# Patient Record
Sex: Male | Born: 1965 | Race: Black or African American | Hispanic: No | Smoking: Former smoker
Health system: Southern US, Community
[De-identification: ages and names within clinical notes are randomized; demographics above are authoritative.]

## PROBLEM LIST (undated history)

## (undated) DIAGNOSIS — N189 Chronic kidney disease, unspecified: Secondary | ICD-10-CM

## (undated) DIAGNOSIS — Z940 Kidney transplant status: Secondary | ICD-10-CM

## (undated) DIAGNOSIS — I471 Supraventricular tachycardia, unspecified: Secondary | ICD-10-CM

## (undated) DIAGNOSIS — IMO0001 Reserved for inherently not codable concepts without codable children: Secondary | ICD-10-CM

## (undated) DIAGNOSIS — I10 Essential (primary) hypertension: Secondary | ICD-10-CM

## (undated) DIAGNOSIS — Z992 Dependence on renal dialysis: Secondary | ICD-10-CM

## (undated) DIAGNOSIS — K219 Gastro-esophageal reflux disease without esophagitis: Secondary | ICD-10-CM

## (undated) DIAGNOSIS — A630 Anogenital (venereal) warts: Secondary | ICD-10-CM

## (undated) DIAGNOSIS — I639 Cerebral infarction, unspecified: Secondary | ICD-10-CM

## (undated) HISTORY — PX: NASAL FRACTURE SURGERY: SHX718

## (undated) HISTORY — PX: HERNIA REPAIR: SHX51

## (undated) HISTORY — PX: AV FISTULA PLACEMENT: SHX1204

---

## 1997-11-23 ENCOUNTER — Ambulatory Visit (HOSPITAL_COMMUNITY): Admission: RE | Admit: 1997-11-23 | Discharge: 1997-11-23 | Payer: Self-pay | Admitting: Nephrology

## 2006-06-19 HISTORY — PX: KIDNEY TRANSPLANT: SHX239

## 2009-12-15 ENCOUNTER — Emergency Department (HOSPITAL_BASED_OUTPATIENT_CLINIC_OR_DEPARTMENT_OTHER): Admission: EM | Admit: 2009-12-15 | Discharge: 2009-12-15 | Payer: Self-pay | Admitting: Emergency Medicine

## 2010-02-02 ENCOUNTER — Emergency Department (HOSPITAL_BASED_OUTPATIENT_CLINIC_OR_DEPARTMENT_OTHER): Admission: EM | Admit: 2010-02-02 | Discharge: 2010-02-03 | Payer: Self-pay | Admitting: Emergency Medicine

## 2010-02-03 ENCOUNTER — Ambulatory Visit: Payer: Self-pay | Admitting: Diagnostic Radiology

## 2010-05-06 ENCOUNTER — Encounter
Admission: RE | Admit: 2010-05-06 | Discharge: 2010-06-14 | Payer: Self-pay | Source: Home / Self Care | Attending: Family Medicine | Admitting: Family Medicine

## 2010-06-01 ENCOUNTER — Emergency Department (HOSPITAL_BASED_OUTPATIENT_CLINIC_OR_DEPARTMENT_OTHER)
Admission: EM | Admit: 2010-06-01 | Discharge: 2010-06-01 | Payer: Self-pay | Source: Home / Self Care | Admitting: Emergency Medicine

## 2010-06-20 ENCOUNTER — Encounter: Admit: 2010-06-20 | Payer: Self-pay | Admitting: Family Medicine

## 2010-06-22 ENCOUNTER — Emergency Department (HOSPITAL_BASED_OUTPATIENT_CLINIC_OR_DEPARTMENT_OTHER)
Admission: EM | Admit: 2010-06-22 | Discharge: 2010-06-22 | Payer: Self-pay | Source: Home / Self Care | Admitting: Emergency Medicine

## 2010-06-29 ENCOUNTER — Encounter
Admission: RE | Admit: 2010-06-29 | Discharge: 2010-07-18 | Payer: Self-pay | Source: Home / Self Care | Attending: Family Medicine | Admitting: Family Medicine

## 2010-07-20 ENCOUNTER — Ambulatory Visit: Payer: Medicare Other | Attending: Family Medicine | Admitting: Rehabilitation

## 2010-07-20 DIAGNOSIS — M545 Low back pain, unspecified: Secondary | ICD-10-CM | POA: Insufficient documentation

## 2010-07-20 DIAGNOSIS — M2569 Stiffness of other specified joint, not elsewhere classified: Secondary | ICD-10-CM | POA: Insufficient documentation

## 2010-07-20 DIAGNOSIS — IMO0001 Reserved for inherently not codable concepts without codable children: Secondary | ICD-10-CM | POA: Insufficient documentation

## 2010-07-21 ENCOUNTER — Encounter: Payer: Medicare Other | Admitting: Physical Therapy

## 2010-08-30 LAB — COMPREHENSIVE METABOLIC PANEL
ALT: 31 U/L (ref 0–53)
AST: 33 U/L (ref 0–37)
Albumin: 4.4 g/dL (ref 3.5–5.2)
Alkaline Phosphatase: 124 U/L — ABNORMAL HIGH (ref 39–117)
CO2: 18 mEq/L — ABNORMAL LOW (ref 19–32)
Calcium: 9.3 mg/dL (ref 8.4–10.5)
Creatinine, Ser: 1.8 mg/dL — ABNORMAL HIGH (ref 0.4–1.5)
Glucose, Bld: 95 mg/dL (ref 70–99)
Potassium: 5.8 mEq/L — ABNORMAL HIGH (ref 3.5–5.1)

## 2010-08-30 LAB — URINALYSIS, ROUTINE W REFLEX MICROSCOPIC
Bilirubin Urine: NEGATIVE
Specific Gravity, Urine: 1.018 (ref 1.005–1.030)
pH: 5.5 (ref 5.0–8.0)

## 2010-08-30 LAB — BASIC METABOLIC PANEL
BUN: 24 mg/dL — ABNORMAL HIGH (ref 6–23)
Calcium: 8.8 mg/dL (ref 8.4–10.5)
Calcium: 9.1 mg/dL (ref 8.4–10.5)
Chloride: 111 mEq/L (ref 96–112)
Creatinine, Ser: 1.7 mg/dL — ABNORMAL HIGH (ref 0.4–1.5)
GFR calc Af Amer: 53 mL/min — ABNORMAL LOW (ref >60)
GFR calc non Af Amer: 41 mL/min — ABNORMAL LOW (ref >60)
Glucose, Bld: 120 mg/dL — ABNORMAL HIGH (ref 70–99)
Potassium: 4.6 mEq/L (ref 3.5–5.1)

## 2010-08-30 LAB — LIPASE, BLOOD: Lipase: 462 U/L — ABNORMAL HIGH (ref 23–300)

## 2011-03-31 ENCOUNTER — Encounter: Payer: Self-pay | Admitting: *Deleted

## 2011-03-31 ENCOUNTER — Emergency Department (HOSPITAL_BASED_OUTPATIENT_CLINIC_OR_DEPARTMENT_OTHER)
Admission: EM | Admit: 2011-03-31 | Discharge: 2011-03-31 | Disposition: A | Discharge status: Home / Self Care | Payer: Medicare Other | Attending: Emergency Medicine | Admitting: Emergency Medicine

## 2011-03-31 DIAGNOSIS — M25579 Pain in unspecified ankle and joints of unspecified foot: Secondary | ICD-10-CM | POA: Insufficient documentation

## 2011-03-31 DIAGNOSIS — M25572 Pain in left ankle and joints of left foot: Secondary | ICD-10-CM

## 2011-03-31 DIAGNOSIS — M109 Gout, unspecified: Secondary | ICD-10-CM | POA: Insufficient documentation

## 2011-03-31 DIAGNOSIS — N189 Chronic kidney disease, unspecified: Secondary | ICD-10-CM | POA: Insufficient documentation

## 2011-03-31 DIAGNOSIS — I129 Hypertensive chronic kidney disease with stage 1 through stage 4 chronic kidney disease, or unspecified chronic kidney disease: Secondary | ICD-10-CM | POA: Insufficient documentation

## 2011-03-31 HISTORY — DX: Chronic kidney disease, unspecified: N18.9

## 2011-03-31 HISTORY — DX: Essential (primary) hypertension: I10

## 2011-03-31 HISTORY — DX: Reserved for inherently not codable concepts without codable children: IMO0001

## 2011-03-31 HISTORY — DX: Gastro-esophageal reflux disease without esophagitis: K21.9

## 2011-03-31 MED ORDER — OXYCODONE-ACETAMINOPHEN 5-325 MG PO TABS
1.0000 | ORAL_TABLET | ORAL | Status: DC | PRN
  Administered 2011-03-31: 1 via ORAL
  Filled 2011-03-31: qty 1

## 2011-03-31 MED ORDER — PREDNISONE 10 MG PO TABS: 40.0000 mg | ORAL_TABLET | Freq: Every day | ORAL | Status: AC

## 2011-03-31 MED ORDER — KETOROLAC TROMETHAMINE 15 MG/ML IJ SOLN
15.0000 mg | Freq: Once | INTRAMUSCULAR | Status: AC
  Administered 2011-03-31: 15 mg via INTRAVENOUS
  Filled 2011-03-31: qty 1

## 2011-03-31 MED ORDER — OXYCODONE-ACETAMINOPHEN 5-325 MG PO TABS: 2.0000 | ORAL_TABLET | ORAL | Status: AC | PRN

## 2011-03-31 MED ORDER — PREDNISONE 50 MG PO TABS
60.0000 mg | ORAL_TABLET | Freq: Once | ORAL | Status: AC
  Administered 2011-03-31: 60 mg via ORAL
  Filled 2011-03-31: qty 1

## 2011-03-31 NOTE — ED Notes (Signed)
Patient states he developed sudden onset of left foot pain.  Pain gradually increased all day.  Denies injury.  Past hx of gout.

## 2011-03-31 NOTE — ED Provider Notes (Signed)
History   45yM with L foot pain. Do not agree with triage note. Pt states gradual onset pain. Denies trauma. Increasing. No fever or chills. No n/v. No numbness or tingling. Ambulating. No rash. Denies signficant acute pain anywehre else.  CSN: [id number] Arrival date & time: 03/31/2011 11:23 AM  Chief Complaint  Patient presents with  . Foot Pain    Left foot pain    (Consider location/radiation/quality/duration/timing/severity/associated sxs/prior treatment) HPI  Past Medical History  Diagnosis Date  . Chronic renal failure     S/P renal transplant  . Hypertension   . Reflux     Past Surgical History  Procedure Date  . Kidney transplant   . Av fistula placement     left arm  . Hernia repair   . Nasal fracture surgery     No family history on file.  History  Substance Use Topics  . Smoking status: Former Games developer  . Smokeless tobacco: Not on file  . Alcohol Use: No      Review of Systems   Review of symptoms negative unless otherwise noted in HPI.   Allergies  Ivp dye  Home Medications   Current Outpatient Rx  Name Route Sig Dispense Refill  . AMLODIPINE BESYLATE 10 MG PO TABS Oral Take 10 mg by mouth daily.      Marland Kitchen CLONIDINE HCL 0.3 MG PO TABS Oral Take 0.3 mg by mouth 2 (two) times daily.      Marland Kitchen ESOMEPRAZOLE MAGNESIUM 20 MG PO CPDR Oral Take 10 mg by mouth daily before breakfast.      . IBUPROFEN 200 MG PO TABS Oral Take 200 mg by mouth every 6 (six) hours as needed.      Marland Kitchen METOPROLOL SUCCINATE 200 MG PO TB24 Oral Take 200 mg by mouth 2 (two) times daily.      Marland Kitchen SPIRONOLACTONE 25 MG PO TABS Oral Take 25 mg by mouth daily.      Marland Kitchen TACROLIMUS 1 MG PO CAPS Oral Take 6 mg by mouth 2 (two) times daily.      Marland Kitchen PREDNISONE 10 MG PO TABS Oral Take 4 tablets (40 mg total) by mouth daily. 10 tablet 0    BP 153/85  Pulse 74  Temp(Src) 97.7 F (36.5 C) (Oral)  Resp 20  Ht 6\' 1"  (1.854 m)  Wt 241 lb (109.317 kg)  BMI 31.80 kg/m2  SpO2 100%  Physical Exam    Nursing note and vitals reviewed. Constitutional: He appears well-developed and well-nourished. No distress.  HENT:  Head: Normocephalic and atraumatic.  Eyes: Conjunctivae are normal. Right eye exhibits no discharge. Left eye exhibits no discharge.  Neck: Neck supple.  Cardiovascular: Normal rate, regular rhythm and normal heart sounds.  Exam reveals no gallop and no friction rub.   No murmur heard. Pulmonary/Chest: Effort normal and breath sounds normal. No respiratory distress.  Abdominal: Soft. He exhibits no distension. There is no tenderness.  Musculoskeletal:       Mild diffuse swelling L ankle. No signifcant skin changes otherwise. neurovascualarly intact. Increased pain with rom both passive and aactive.  Neurological: He is alert.  Skin: Skin is warm and dry.  Psychiatric: He has a normal mood and affect. His behavior is normal. Thought content normal.    ED Course  Procedures (including critical care time)  Labs Reviewed - No data to display No results found.   1. Ankle pain, left   2. Gout attack       MDM  45yM with L ankle pain. Atraumatic. Hx of gout and clinically suspect. Same. Also consider septic joint but clinically less likley. Plan symptomatic tx and pcp fu.        Raeford Razor, MD 04/04/11 [phone]

## 2011-04-21 ENCOUNTER — Encounter (HOSPITAL_BASED_OUTPATIENT_CLINIC_OR_DEPARTMENT_OTHER): Payer: Self-pay | Admitting: *Deleted

## 2011-04-21 ENCOUNTER — Emergency Department (INDEPENDENT_AMBULATORY_CARE_PROVIDER_SITE_OTHER): Payer: Medicare Other

## 2011-04-21 ENCOUNTER — Emergency Department (HOSPITAL_BASED_OUTPATIENT_CLINIC_OR_DEPARTMENT_OTHER)
Admission: EM | Admit: 2011-04-21 | Discharge: 2011-04-21 | Disposition: A | Discharge status: Home / Self Care | Payer: Medicare Other | Attending: Emergency Medicine | Admitting: Emergency Medicine

## 2011-04-21 DIAGNOSIS — Z940 Kidney transplant status: Secondary | ICD-10-CM | POA: Insufficient documentation

## 2011-04-21 DIAGNOSIS — M25471 Effusion, right ankle: Secondary | ICD-10-CM

## 2011-04-21 DIAGNOSIS — M25476 Effusion, unspecified foot: Secondary | ICD-10-CM | POA: Insufficient documentation

## 2011-04-21 DIAGNOSIS — I10 Essential (primary) hypertension: Secondary | ICD-10-CM | POA: Insufficient documentation

## 2011-04-21 DIAGNOSIS — M79609 Pain in unspecified limb: Secondary | ICD-10-CM | POA: Insufficient documentation

## 2011-04-21 DIAGNOSIS — M25579 Pain in unspecified ankle and joints of unspecified foot: Secondary | ICD-10-CM

## 2011-04-21 DIAGNOSIS — M25473 Effusion, unspecified ankle: Secondary | ICD-10-CM | POA: Insufficient documentation

## 2011-04-21 DIAGNOSIS — K219 Gastro-esophageal reflux disease without esophagitis: Secondary | ICD-10-CM | POA: Insufficient documentation

## 2011-04-21 LAB — GRAM STAIN: Special Requests: NORMAL

## 2011-04-21 LAB — SYNOVIAL CELL COUNT + DIFF, W/ CRYSTALS
Crystals, Fluid: NONE SEEN
Monocyte-Macrophage-Synovial Fluid: 18 % — ABNORMAL LOW (ref 50–90)
WBC, Synovial: 33 /mm3 (ref 0–200)

## 2011-04-21 MED ORDER — LIDOCAINE HCL (PF) 1 % IJ SOLN
INTRAMUSCULAR | Status: AC
  Administered 2011-04-21: 5 mL via INTRADERMAL
  Filled 2011-04-21: qty 5

## 2011-04-21 MED ORDER — LIDOCAINE HCL (PF) 1 % IJ SOLN
5.0000 mL | Freq: Once | INTRAMUSCULAR | Status: AC
  Administered 2011-04-21: 5 mL via INTRADERMAL

## 2011-04-21 MED ORDER — PREDNISONE 50 MG PO TABS: ORAL_TABLET | ORAL | Status: AC

## 2011-04-21 MED ORDER — OXYCODONE-ACETAMINOPHEN 5-325 MG PO TABS
1.0000 | ORAL_TABLET | Freq: Once | ORAL | Status: AC
  Administered 2011-04-21: 1 via ORAL
  Filled 2011-04-21: qty 1

## 2011-04-21 MED ORDER — HYDROMORPHONE HCL PF 2 MG/ML IJ SOLN
2.0000 mg | Freq: Once | INTRAMUSCULAR | Status: AC
  Administered 2011-04-21: 2 mg via INTRAMUSCULAR
  Filled 2011-04-21: qty 20

## 2011-04-21 MED ORDER — OXYCODONE-ACETAMINOPHEN 5-325 MG PO TABS: 1.0000 | ORAL_TABLET | ORAL | Status: AC | PRN

## 2011-04-21 NOTE — ED Provider Notes (Signed)
History     CSN: [id number] Arrival date & time: 04/21/2011  1:39 PM   First MD Initiated Contact with Patient 04/21/11 1350      Chief Complaint  Patient presents with  . Foot Pain    (Consider location/radiation/quality/duration/timing/severity/associated sxs/prior treatment) Patient is a 45 y.o. male presenting with lower extremity pain. The history is provided by the patient.  Foot Pain The current episode started more than 2 days ago. The problem occurs daily. The problem has been gradually worsening. Pertinent negatives include no chest pain and no shortness of breath. The symptoms are aggravated by standing. The symptoms are relieved by rest.  pt reports gradual onset of right foot/ankle pain over past week No trauma Reports it hurts to walk Reports h/o gout but denies any joint aspirations H/o renal failure, but he is s/p transplant 2008 no longer on Hemodialysis No cp/sob No fever Denies h/o DVT  Past Medical History  Diagnosis Date  . Chronic renal failure     S/P renal transplant  . Hypertension   . Reflux   . Gout     Past Surgical History  Procedure Date  . Kidney transplant   . Av fistula placement     left arm  . Hernia repair   . Nasal fracture surgery     History reviewed. No pertinent family history.  History  Substance Use Topics  . Smoking status: Former Games developer  . Smokeless tobacco: Not on file  . Alcohol Use: No      Review of Systems  Respiratory: Negative for shortness of breath.   Cardiovascular: Negative for chest pain.  All other systems reviewed and are negative.    Allergies  Ivp dye  Home Medications   Current Outpatient Rx  Name Route Sig Dispense Refill  . AMLODIPINE BESYLATE 10 MG PO TABS Oral Take 10 mg by mouth daily.      Marland Kitchen CLONIDINE HCL 0.3 MG PO TABS Oral Take 0.3 mg by mouth 2 (two) times daily.      Marland Kitchen ESOMEPRAZOLE MAGNESIUM 20 MG PO CPDR Oral Take 10 mg by mouth daily before breakfast.      . IBUPROFEN 200  MG PO TABS Oral Take 200 mg by mouth every 6 (six) hours as needed.      Marland Kitchen METOPROLOL SUCCINATE 200 MG PO TB24 Oral Take 200 mg by mouth 2 (two) times daily.      Marland Kitchen SPIRONOLACTONE 25 MG PO TABS Oral Take 25 mg by mouth daily.      Marland Kitchen TACROLIMUS 1 MG PO CAPS Oral Take 6 mg by mouth 2 (two) times daily.        BP 159/97  Pulse 73  Temp(Src) 98.4 F (36.9 C) (Oral)  Resp 16  Ht 6\' 1"  (1.854 m)  Wt 240 lb (108.863 kg)  BMI 31.66 kg/m2  SpO2 99%  Physical Exam CONSTITUTIONAL: Well developed/well nourished HEAD AND FACE: Normocephalic/atraumatic EYES: EOMI/PERRL ENMT: Mucous membranes moist NECK: supple no meningeal signs CV: S1/S2 noted, no murmurs/rubs/gallops noted LUNGS: Lungs are clear to auscultation bilaterally, no apparent distress ABDOMEN: soft, nontender, no rebound or guarding NEURO: Pt is awake/alert, moves all extremitiesx4 EXTREMITIES: pulses normal, full ROM Tender to right medial malleolus with localized swelling, minimal erythema.  No crepitance.  No streaking of erythema.  No calf tenderness.  Right achilles intact.  No distal right foot tenderness No pitting edema noted in either LE SKIN: warm, color normal PSYCH: no abnormalities of mood noted  ED  Course  Apiration of blood/fluid Date/Time: 04/21/2011 2:59 PM Performed by: Joya Gaskins Authorized by: Joya Gaskins Consent: Written consent obtained. Risks and benefits: risks, benefits and alternatives were discussed Consent given by: patient Patient understanding: patient states understanding of the procedure being performed Patient consent: the patient's understanding of the procedure matches consent given Procedure consent: procedure consent matches procedure scheduled Relevant documents: relevant documents present and verified Site marked: the operative site was marked Imaging studies: imaging studies available Patient identity confirmed: verbally with patient, arm band and provided demographic  data Time out: Immediately prior to procedure a "time out" was called to verify the correct patient, procedure, equipment, support staff and site/side marked as required. Preparation: Patient was prepped and draped in the usual sterile fashion. Local anesthesia used: yes Anesthesia: local infiltration Local anesthetic: lidocaine 1% without epinephrine Anesthetic total: 4 ml Patient sedated: no Patient tolerance: Patient tolerated the procedure well with no immediate complications. Comments: Right ankle arthrocentesis 2ml of synovial fluid extracted Pt had no immediate complications     MDM  Nursing notes reviewed and considered in documentation Previous records reviewed and considered xrays reviewed and considered  2:14 PM Pt reports he was told he has gout but no definitive diagnosis Reports similar history of this pain in left ankle  Pt with frequent ankle swelling, never been formally diagnosed with gout, he allowed me to perform arthrocentesis to r/o septic joint   3:18 PM PT HAS NO COMPLICATIONS FROM PROCEDURE ONLY SMALL AMT OF FLUID EXTRACTED, SO LIKELY WILL ONLY GET CULTURE SUSPICION FOR GOUT STILL HIGH, WILL GIVE RX FOR PREDNISONE BUT ASKED HIM TO HOLD UNTIL CULTURES ARE CONFIRMED NEGATIVE.  PT AGREEABLE.  TOLD TO USE CRUTCHES, F/U WITH ORTHO BP 152/98  Pulse 62  Temp(Src) 98.4 F (36.9 C) (Oral)  Resp 17  Ht 6\' 1"  (1.854 m)  Wt 240 lb (108.863 kg)  BMI 31.66 kg/m2  SpO2 100%   Joya Gaskins, MD 04/21/11 1526

## 2011-04-21 NOTE — ED Notes (Signed)
Pt c/o right foot pain x 1 week Hx gout

## 2011-04-22 ENCOUNTER — Encounter (HOSPITAL_BASED_OUTPATIENT_CLINIC_OR_DEPARTMENT_OTHER): Payer: Self-pay | Admitting: *Deleted

## 2011-04-22 ENCOUNTER — Emergency Department (HOSPITAL_BASED_OUTPATIENT_CLINIC_OR_DEPARTMENT_OTHER)
Admission: EM | Admit: 2011-04-22 | Discharge: 2011-04-22 | Disposition: A | Discharge status: Home / Self Care | Payer: Medicare Other | Attending: Emergency Medicine | Admitting: Emergency Medicine

## 2011-04-22 DIAGNOSIS — K219 Gastro-esophageal reflux disease without esophagitis: Secondary | ICD-10-CM | POA: Insufficient documentation

## 2011-04-22 DIAGNOSIS — M79673 Pain in unspecified foot: Secondary | ICD-10-CM

## 2011-04-22 DIAGNOSIS — M25579 Pain in unspecified ankle and joints of unspecified foot: Secondary | ICD-10-CM | POA: Insufficient documentation

## 2011-04-22 DIAGNOSIS — Z79899 Other long term (current) drug therapy: Secondary | ICD-10-CM | POA: Insufficient documentation

## 2011-04-22 DIAGNOSIS — I10 Essential (primary) hypertension: Secondary | ICD-10-CM | POA: Insufficient documentation

## 2011-04-22 HISTORY — DX: Kidney transplant status: Z94.0

## 2011-04-22 LAB — BASIC METABOLIC PANEL
BUN: 29 mg/dL — ABNORMAL HIGH (ref 6–23)
CO2: 24 mEq/L (ref 19–32)
Chloride: 100 mEq/L (ref 96–112)
GFR calc non Af Amer: 38 mL/min — ABNORMAL LOW (ref >90)
Glucose, Bld: 138 mg/dL — ABNORMAL HIGH (ref 70–99)
Potassium: 3.8 mEq/L (ref 3.5–5.1)

## 2011-04-22 MED ORDER — HYDROMORPHONE HCL PF 2 MG/ML IJ SOLN
2.0000 mg | Freq: Once | INTRAMUSCULAR | Status: AC
Start: 2011-04-22 — End: 2011-04-22
  Administered 2011-04-22: 2 mg via INTRAMUSCULAR
  Filled 2011-04-22: qty 1

## 2011-04-22 MED ORDER — HYDROMORPHONE HCL PF 2 MG/ML IJ SOLN
2.0000 mg | Freq: Once | INTRAMUSCULAR | Status: AC
  Administered 2011-04-22: 2 mg via INTRAMUSCULAR
  Filled 2011-04-22: qty 1
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
## 2011-04-22 NOTE — ED Provider Notes (Signed)
Scribed for Glynn Octave, MD, the patient was seen in room MH03/MH03. This chart was scribed by [employer]. The patient's care started at 18:33  CSN: [id number] Arrival date & time: 04/22/2011  6:17 PM   First MD Initiated Contact with Patient 04/22/11 1833      Chief Complaint  Patient presents with  . Foot Pain   HPI Nicholas Green is a 45 y.o. male who presents to the Emergency Department complaining of Right foot pain. Patient states that he was seen here yesterday and was diagnosed with gout. States that he was prescribed Percocet and Steroids that have not alleviated his pain. He denies any fevers, recent trauma to the foot or any other joint pain. States that pain is aggravated by bearing weight on foot. Patient appears distressed secondary to foot pain.  Past Medical History  Diagnosis Date  . Hypertension   . Reflux   . Gout   . Chronic renal failure     S/P renal transplant  . S/P kidney transplant     Past Surgical History  Procedure Date  . Kidney transplant   . Av fistula placement     left arm  . Hernia repair   . Nasal fracture surgery     No family history on file.  History  Substance Use Topics  . Smoking status: Former Games developer  . Smokeless tobacco: Never Used  . Alcohol Use: No      Review of Systems  Constitutional: Negative for fever.  Musculoskeletal: Negative for joint swelling.       Right foot pain  All other systems reviewed and are negative.    Allergies  Ivp dye  Home Medications   Current Outpatient Rx  Name Route Sig Dispense Refill  . AMLODIPINE BESYLATE 10 MG PO TABS Oral Take 10 mg by mouth daily.      Marland Kitchen CLONIDINE HCL 0.3 MG PO TABS Oral Take 0.3 mg by mouth 2 (two) times daily.      Marland Kitchen ESOMEPRAZOLE MAGNESIUM 40 MG PO CPDR Oral Take 40 mg by mouth daily.      Marland Kitchen METOPROLOL SUCCINATE 200 MG PO TB24 Oral Take 200 mg by mouth 2 (two) times daily.      . OXYCODONE-ACETAMINOPHEN 5-325 MG PO TABS Oral Take 1 tablet by  mouth every 4 (four) hours as needed for pain. 15 tablet 0  . SPIRONOLACTONE 25 MG PO TABS Oral Take 25 mg by mouth daily.      Marland Kitchen TACROLIMUS 1 MG PO CAPS Oral Take 6 mg by mouth 2 (two) times daily.      Marland Kitchen PREDNISONE 50 MG PO TABS  DO NOT TAKE UNTIL YOU HAVE FOUND OUT THAT YOUR TESTS WERE NORMAL  5 tablet 0    BP 159/89  Pulse 80  Temp(Src) 98.9 F (37.2 C) (Oral)  Resp 18  Ht 6\' 1"  (1.854 m)  Wt 245 lb (111.131 kg)  BMI 32.32 kg/m2  SpO2 100%  Physical Exam  Nursing note and vitals reviewed. Constitutional: He is oriented to person, place, and time. He appears well-developed and well-nourished.  HENT:  Head: Normocephalic.  Eyes: Conjunctivae are normal.  Neck: No tracheal deviation present.  Cardiovascular: Normal rate.   No murmur heard. Pulses:      Dorsalis pedis pulses are 2+ on the right side, and 2+ on the left side.       Posterior tibial pulses are 2+ on the right side, and 2+ on the left side.  Pulmonary/Chest: Effort normal and breath sounds normal. No respiratory distress. He has no wheezes.  Abdominal: Soft. Bowel sounds are normal. There is no tenderness.  Musculoskeletal: Normal range of motion. He exhibits tenderness.       Patient with limited range of motion in the right ankle secondary to pain  Tender to the right medial malleolus, mild swelling No erythema No streaking No calf tenderness Other joints normal  Neurological: He is alert and oriented to person, place, and time. No cranial nerve deficit.  Skin: Skin is warm and dry. No rash noted. He is not diaphoretic. No erythema.  Psychiatric: He has a normal mood and affect. His behavior is normal.    ED Course  Procedures   DIAGNOSTIC STUDIES: Oxygen Saturation is 100% on room air, normal by my interpretation.    COORDINATION OF CARE: 18:40 - EDP examined patient at bedside and ordered the following Orders Placed This Encounter  Procedures  . Basic metabolic panel  20:15 - EDP rechecked  patient at bedside. Patient reports relief from pain secondary to Dilaudid injection. Patient made aware of synovial fluid culture. No crystals or positive sign of infection noted at this time. Patient to be discharged with appropriate pain management. He is aware of plan and is in agreement.  Results for orders placed during the hospital encounter of 04/22/11  BASIC METABOLIC PANEL      Component Value Range   Sodium 136  135 - 145 (mEq/L)   Potassium 3.8  3.5 - 5.1 (mEq/L)   Chloride 100  96 - 112 (mEq/L)   CO2 24  19 - 32 (mEq/L)   Glucose, Bld 138 (*) 70 - 99 (mg/dL)   BUN 29 (*) 6 - 23 (mg/dL)   Creatinine, Ser 0.45 (*) 0.50 - 1.35 (mg/dL)   Calcium 9.3  8.4 - 40.9 (mg/dL)   GFR calc non Af Amer 38 (*) >90 (mL/min)   GFR calc Af Amer 44 (*) >90 (mL/min)     MDM: One week of atraumatic right ankle pain and swelling. Seen yesterday and had arthrocentesis performed. This pain is not controlled with Percocet at home. He is unable to bear weight today. X-rays reviewed from yesterday showed no abnormality.  Arthrocentesis results showed no organisms on Gram stain and preliminary negative culture. He has 33 white cells, no crystals.  Synovial fluid not consistent with septic joint or gout.  Follow up with orthopedics, use crutches and cast shoe.   Scribe Attestation I personally performed the services described in this documentation, which was scribed in my presence.  The recorded information has been reviewed and considered.   Glynn Octave, MD 04/22/11 [phone]

## 2011-04-22 NOTE — ED Notes (Signed)
Pt reports he was seen here yesterday for right foot pain- dx with gout- states sx are no better- meds not effective

## 2011-04-25 LAB — BODY FLUID CULTURE: Culture: NO GROWTH

## 2011-10-17 ENCOUNTER — Emergency Department (HOSPITAL_BASED_OUTPATIENT_CLINIC_OR_DEPARTMENT_OTHER)
Admission: EM | Admit: 2011-10-17 | Discharge: 2011-10-17 | Disposition: A | Discharge status: Home / Self Care | Payer: Medicare Other | Attending: Emergency Medicine | Admitting: Emergency Medicine

## 2011-10-17 ENCOUNTER — Encounter (HOSPITAL_BASED_OUTPATIENT_CLINIC_OR_DEPARTMENT_OTHER): Payer: Self-pay | Admitting: *Deleted

## 2011-10-17 DIAGNOSIS — M109 Gout, unspecified: Secondary | ICD-10-CM | POA: Insufficient documentation

## 2011-10-17 DIAGNOSIS — K219 Gastro-esophageal reflux disease without esophagitis: Secondary | ICD-10-CM | POA: Insufficient documentation

## 2011-10-17 DIAGNOSIS — I10 Essential (primary) hypertension: Secondary | ICD-10-CM | POA: Insufficient documentation

## 2011-10-17 DIAGNOSIS — Z760 Encounter for issue of repeat prescription: Secondary | ICD-10-CM | POA: Insufficient documentation

## 2011-10-17 DIAGNOSIS — Z87891 Personal history of nicotine dependence: Secondary | ICD-10-CM | POA: Insufficient documentation

## 2011-10-17 DIAGNOSIS — Z940 Kidney transplant status: Secondary | ICD-10-CM | POA: Insufficient documentation

## 2011-10-17 DIAGNOSIS — Z79899 Other long term (current) drug therapy: Secondary | ICD-10-CM | POA: Insufficient documentation

## 2011-10-17 MED ORDER — OXYCODONE-ACETAMINOPHEN 10-325 MG PO TABS: 1.0000 | ORAL_TABLET | Freq: Three times a day (TID) | ORAL | Status: AC | PRN

## 2011-10-17 MED ORDER — ZOLPIDEM TARTRATE 10 MG PO TABS: 10.0000 mg | ORAL_TABLET | Freq: Every evening | ORAL | Status: DC | PRN

## 2011-10-17 NOTE — ED Provider Notes (Signed)
History     CSN: [id number]  Arrival date & time 10/17/11  1315   First MD Initiated Contact with Patient 10/17/11 1349      Chief Complaint  Patient presents with  . Medication Refill    (Consider location/radiation/quality/duration/timing/severity/associated sxs/prior treatment) HPI Comments: Patient with history of chronic back pain.  Has been out of percocet and ambien for the past week.  Tried to see pcp today and was told his provider was on vacation.  They told him to come to the ER for a refill of his prescriptions.  He denies any new injury or trauma.  No bowel or bladder complaints.    The history is provided by the patient.    Past Medical History  Diagnosis Date  . Hypertension   . Reflux   . Gout   . Chronic renal failure     S/P renal transplant  . S/P kidney transplant     Past Surgical History  Procedure Date  . Kidney transplant   . Av fistula placement     left arm  . Hernia repair   . Nasal fracture surgery     No family history on file.  History  Substance Use Topics  . Smoking status: Former Games developer  . Smokeless tobacco: Never Used  . Alcohol Use: No      Review of Systems  All other systems reviewed and are negative.    Allergies  Ivp dye  Home Medications   Current Outpatient Rx  Name Route Sig Dispense Refill  . AMLODIPINE BESYLATE 10 MG PO TABS Oral Take 10 mg by mouth daily.      Marland Kitchen CLONIDINE HCL 0.3 MG PO TABS Oral Take 0.3 mg by mouth 2 (two) times daily.      Marland Kitchen ESOMEPRAZOLE MAGNESIUM 40 MG PO CPDR Oral Take 40 mg by mouth daily.      Marland Kitchen METOPROLOL SUCCINATE ER 200 MG PO TB24 Oral Take 200 mg by mouth 2 (two) times daily.      Marland Kitchen SPIRONOLACTONE 25 MG PO TABS Oral Take 25 mg by mouth daily.      Marland Kitchen TACROLIMUS 1 MG PO CAPS Oral Take 6 mg by mouth 2 (two) times daily.        BP 142/81  Pulse 67  Temp 98 F (36.7 C)  Resp 20  Ht 6\' 1"  (1.854 m)  Wt 254 lb (115.214 kg)  BMI 33.51 kg/m2  SpO2 98%  Physical Exam  Nursing  note and vitals reviewed. Constitutional: He is oriented to person, place, and time. He appears well-developed and well-nourished. No distress.  HENT:  Head: Normocephalic and atraumatic.  Neck: Normal range of motion. Neck supple.  Musculoskeletal: Normal range of motion.  Neurological: He is alert and oriented to person, place, and time.  Skin: Skin is warm and dry. He is not diaphoretic.    ED Course  Procedures (including critical care time)  Labs Reviewed - No data to display No results found.   No diagnosis found.    MDM  I attempted to call the patient's pcp office ([facility] in [place]), but only got the answering machine.  I will prescribe a few of these medications for this patient and have advised him that we will be unable to refill these in the future.          Geoffery Lyons, MD 10/17/11 [phone]

## 2011-10-17 NOTE — ED Notes (Signed)
Patient states he is here today for refill of percocet 10mg  and ambiem 10mg .  Has been out for one week.  Was given a card from [facility] stating that his doctor is out of town until 10/27/11 and has an appointment for 11/08/11.

## 2011-10-17 NOTE — Discharge Instructions (Signed)
Medication Refill, Emergency Department  We have refilled your medication today as a courtesy to you. It is best for your medical care, however, to take care of getting refills done through your primary caregiver's office. They have your records and can do a better job of follow-up than we can in the emergency department.  On maintenance medications, we often only prescribe enough medications to get you by until you are able to see your regular caregiver. This is a more expensive way to refill medications.  In the future, please plan for refills so that you will not have to use the emergency department for this.  Thank you for your help. Your help allows us to better take care of the daily emergencies that enter our department.  Document Released: 09/22/2003 Document Revised: 05/25/2011 Document Reviewed: 06/05/2005  [employer] Patient Information 2012 [employer]

## 2011-10-17 NOTE — ED Notes (Signed)
Pt reports chronic back pain and is here for refill on Percocet and Ambien.  Unable to see PMD until 11/08/11.

## 2012-07-06 ENCOUNTER — Emergency Department (HOSPITAL_BASED_OUTPATIENT_CLINIC_OR_DEPARTMENT_OTHER): Payer: Medicare Other

## 2012-07-06 ENCOUNTER — Emergency Department (HOSPITAL_BASED_OUTPATIENT_CLINIC_OR_DEPARTMENT_OTHER)
Admission: EM | Admit: 2012-07-06 | Discharge: 2012-07-07 | Disposition: A | Discharge status: Home / Self Care | Payer: Medicare Other | Attending: Emergency Medicine | Admitting: Emergency Medicine

## 2012-07-06 ENCOUNTER — Encounter (HOSPITAL_BASED_OUTPATIENT_CLINIC_OR_DEPARTMENT_OTHER): Payer: Self-pay | Admitting: *Deleted

## 2012-07-06 DIAGNOSIS — M545 Low back pain, unspecified: Secondary | ICD-10-CM | POA: Insufficient documentation

## 2012-07-06 DIAGNOSIS — J189 Pneumonia, unspecified organism: Secondary | ICD-10-CM | POA: Insufficient documentation

## 2012-07-06 DIAGNOSIS — J029 Acute pharyngitis, unspecified: Secondary | ICD-10-CM | POA: Insufficient documentation

## 2012-07-06 DIAGNOSIS — K219 Gastro-esophageal reflux disease without esophagitis: Secondary | ICD-10-CM | POA: Insufficient documentation

## 2012-07-06 DIAGNOSIS — Z79899 Other long term (current) drug therapy: Secondary | ICD-10-CM | POA: Insufficient documentation

## 2012-07-06 DIAGNOSIS — I129 Hypertensive chronic kidney disease with stage 1 through stage 4 chronic kidney disease, or unspecified chronic kidney disease: Secondary | ICD-10-CM | POA: Insufficient documentation

## 2012-07-06 DIAGNOSIS — N189 Chronic kidney disease, unspecified: Secondary | ICD-10-CM | POA: Insufficient documentation

## 2012-07-06 DIAGNOSIS — Z7982 Long term (current) use of aspirin: Secondary | ICD-10-CM | POA: Insufficient documentation

## 2012-07-06 DIAGNOSIS — Z992 Dependence on renal dialysis: Secondary | ICD-10-CM | POA: Insufficient documentation

## 2012-07-06 DIAGNOSIS — M549 Dorsalgia, unspecified: Secondary | ICD-10-CM

## 2012-07-06 DIAGNOSIS — Z862 Personal history of diseases of the blood and blood-forming organs and certain disorders involving the immune mechanism: Secondary | ICD-10-CM | POA: Insufficient documentation

## 2012-07-06 DIAGNOSIS — Z8639 Personal history of other endocrine, nutritional and metabolic disease: Secondary | ICD-10-CM | POA: Insufficient documentation

## 2012-07-06 DIAGNOSIS — Z9689 Presence of other specified functional implants: Secondary | ICD-10-CM | POA: Insufficient documentation

## 2012-07-06 HISTORY — DX: Dependence on renal dialysis: Z99.2

## 2012-07-06 LAB — CBC WITH DIFFERENTIAL/PLATELET
Basophils Relative: 1 % (ref 0–1)
Eosinophils Absolute: 0.4 10*3/uL (ref 0.0–0.7)
Eosinophils Relative: 8 % — ABNORMAL HIGH (ref 0–5)
HCT: 32.9 % — ABNORMAL LOW (ref 39.0–52.0)
Hemoglobin: 11 g/dL — ABNORMAL LOW (ref 13.0–17.0)
MCH: 32.4 pg (ref 26.0–34.0)
MCHC: 33.4 g/dL (ref 30.0–36.0)
MCV: 97.1 fL (ref 78.0–100.0)
Monocytes Absolute: 0.9 10*3/uL (ref 0.1–1.0)
Monocytes Relative: 18 % — ABNORMAL HIGH (ref 3–12)

## 2012-07-06 LAB — BASIC METABOLIC PANEL
BUN: 43 mg/dL — ABNORMAL HIGH (ref 6–23)
Creatinine, Ser: 8.9 mg/dL — ABNORMAL HIGH (ref 0.50–1.35)
GFR calc Af Amer: 7 mL/min — ABNORMAL LOW (ref >90)
GFR calc non Af Amer: 6 mL/min — ABNORMAL LOW (ref >90)
Glucose, Bld: 84 mg/dL (ref 70–99)

## 2012-07-06 MED ORDER — OXYCODONE-ACETAMINOPHEN 5-325 MG PO TABS: 1.0000 | ORAL_TABLET | Freq: Four times a day (QID) | ORAL | Status: DC | PRN

## 2012-07-06 MED ORDER — SODIUM CHLORIDE 0.9 % IV SOLN
Freq: Once | INTRAVENOUS | Status: AC
  Administered 2012-07-06: via INTRAVENOUS

## 2012-07-06 MED ORDER — LEVOFLOXACIN 500 MG PO TABS: 500.0000 mg | ORAL_TABLET | ORAL | Status: DC

## 2012-07-06 MED ORDER — CEFEPIME HCL 1 G IJ SOLR
1.0000 g | Freq: Three times a day (TID) | INTRAMUSCULAR | Status: DC
  Administered 2012-07-07: 1 g via INTRAVENOUS

## 2012-07-06 MED ORDER — VANCOMYCIN HCL 10 G IV SOLR
2000.0000 mg | Freq: Once | INTRAVENOUS | Status: DC
  Filled 2012-07-06: qty 2000

## 2012-07-06 MED ORDER — VANCOMYCIN HCL IN DEXTROSE 1-5 GM/200ML-% IV SOLN
1000.0000 mg | Freq: Once | INTRAVENOUS | Status: DC
  Administered 2012-07-07: 2 g via INTRAVENOUS

## 2012-07-06 MED ORDER — OXYCODONE-ACETAMINOPHEN 5-325 MG PO TABS
1.0000 | ORAL_TABLET | Freq: Once | ORAL | Status: AC
  Administered 2012-07-06: 1 via ORAL
  Filled 2012-07-06 (×2): qty 1

## 2012-07-06 MED ORDER — LEVOFLOXACIN IN D5W 750 MG/150ML IV SOLN
750.0000 mg | INTRAVENOUS | Status: DC
  Administered 2012-07-06: 750 mg via INTRAVENOUS
  Filled 2012-07-06: qty 150

## 2012-07-06 NOTE — ED Provider Notes (Signed)
Medical screening examination/treatment/procedure(s) were performed by non-physician practitioner and as supervising physician I was immediately available for consultation/collaboration.   Celene Kras, MD 07/06/12 [phone]

## 2012-07-06 NOTE — ED Provider Notes (Signed)
History     CSN: [id number]  Arrival date & time 07/06/12  2014   First MD Initiated Contact with Patient 07/06/12 2216      Chief Complaint  Patient presents with  . Cough    (Consider location/radiation/quality/duration/timing/severity/associated sxs/prior treatment) HPI Comments: Patient who is a nonsmoker, ESRD, dialysis MWF -- presents with 3-4 days of a productive cough, URI symptoms.  He states he has been coughing up brown phlegm over the past 2 days.  He denies fever but has had chills.  He received the influenza and pneumococcal vaccines this past fall because they were needed for him to start dialysis.  He states that his rib cage has been sore from coughing so much.  He denies SOB, wheezing, and chest pain.  He reports 2 pillow orthopnea for comfort and denies PND.  Reports no leg edema.  No recent surgeries or long periods of travel.  He has been taking Mucinex which has not alleviated his symptoms.  Onset gradual, course constant. Continues to make urine.   Patient is a 47 y.o. male presenting with cough. The history is provided by the patient.  Cough Associated symptoms include chills and sore throat. Pertinent negatives include no chest pain, no ear pain, no headaches, no rhinorrhea, no myalgias, no shortness of breath, no wheezing and no eye redness.    Past Medical History  Diagnosis Date  . Hypertension   . Reflux   . Gout   . Chronic renal failure     S/P renal transplant  . S/P kidney transplant   . Dialysis patient     Past Surgical History  Procedure Date  . Kidney transplant   . Av fistula placement     left arm  . Hernia repair   . Nasal fracture surgery     No family history on file.  History  Substance Use Topics  . Smoking status: Former Games developer  . Smokeless tobacco: Never Used  . Alcohol Use: No      Review of Systems  Constitutional: Positive for chills. Negative for fever and fatigue.  HENT: Positive for congestion and sore throat.  Negative for ear pain, rhinorrhea, neck pain, neck stiffness and sinus pressure.   Eyes: Negative for redness.  Respiratory: Positive for cough. Negative for chest tightness, shortness of breath and wheezing.   Cardiovascular: Negative for chest pain.  Gastrointestinal: Negative for nausea, vomiting, abdominal pain, diarrhea, constipation and blood in stool.  Genitourinary: Negative for dysuria.  Musculoskeletal: Positive for back pain (chronic back pain, at baseline). Negative for myalgias.  Skin: Negative for rash.  Neurological: Negative for dizziness, light-headedness and headaches.    Allergies  Ivp dye  Home Medications   Current Outpatient Rx  Name  Route  Sig  Dispense  Refill  . AMLODIPINE BESY-BENAZEPRIL HCL PO   Oral   Take 10 mg by mouth.         . ASPIRIN 81 MG PO TABS   Oral   Take 81 mg by mouth daily.         Marland Kitchen CARVEDILOL 25 MG PO TABS   Oral   Take 25 mg by mouth 2 (two) times daily with a meal.         . DOCUSATE SODIUM 100 MG PO CAPS   Oral   Take 100 mg by mouth as needed.         Marland Kitchen LOSARTAN POTASSIUM 100 MG PO TABS   Oral   Take 100 mg  by mouth daily.         Marland Kitchen MINOXIDIL 2.5 MG PO TABS   Oral   Take 2.5 mg by mouth daily.         Marland Kitchen NITROGLYCERIN 0.4 MG SL SUBL   Sublingual   Place 0.4 mg under the tongue every 5 (five) minutes as needed.         . OXYCODONE-ACETAMINOPHEN 10-325 MG PO TABS   Oral   Take 1 tablet by mouth every 4 (four) hours as needed.         Marland Kitchen PREDNISOLONE 5 MG PO TABS   Oral   Take 2 mg by mouth daily.         . TORSEMIDE PO   Oral   Take 40 mg by mouth 2 (two) times daily.         Marland Kitchen AMLODIPINE BESYLATE 10 MG PO TABS   Oral   Take 10 mg by mouth daily.           Marland Kitchen CLONIDINE HCL 0.3 MG PO TABS   Oral   Take 0.3 mg by mouth 3 (three) times daily.          Marland Kitchen ESOMEPRAZOLE MAGNESIUM 40 MG PO CPDR   Oral   Take 40 mg by mouth daily.           Marland Kitchen ZOLPIDEM TARTRATE 10 MG PO TABS   Oral    Take 1 tablet (10 mg total) by mouth at bedtime as needed for sleep.   5 tablet   0     BP 163/88  Pulse 81  Temp 98.3 F (36.8 C) (Oral)  Resp 20  Ht 6\' 1"  (1.854 m)  Wt 240 lb (108.863 kg)  BMI 31.66 kg/m2  SpO2 97%  Physical Exam  Nursing note and vitals reviewed. Constitutional: He appears well-developed and well-nourished.  HENT:  Head: Normocephalic and atraumatic.  Right Ear: Tympanic membrane, external ear and ear canal normal.  Left Ear: Tympanic membrane, external ear and ear canal normal.  Nose: No mucosal edema or rhinorrhea.  Mouth/Throat: Oropharynx is clear and moist and mucous membranes are normal.  Eyes: Conjunctivae normal are normal. Pupils are equal, round, and reactive to light. Right eye exhibits no discharge. Left eye exhibits no discharge.  Neck: Normal range of motion. Neck supple.  Cardiovascular: Normal rate, regular rhythm and normal heart sounds.   No murmur heard. Pulmonary/Chest: Effort normal. No respiratory distress. He has no wheezes (mild expiratory at bases). He has rales (L base).  Abdominal: Soft. Bowel sounds are normal. There is no tenderness.  Musculoskeletal:       Thoracic back: Normal.       Lumbar back: He exhibits tenderness. He exhibits normal range of motion and no bony tenderness.       Back:  Lymphadenopathy:    He has no cervical adenopathy.  Neurological: He is alert.  Skin: Skin is warm and dry.  Psychiatric: He has a normal mood and affect.    ED Course  Procedures (including critical care time)  Labs Reviewed  CBC WITH DIFFERENTIAL - Abnormal; Notable for the following:    RBC 3.39 (*)     Hemoglobin 11.0 (*)     HCT 32.9 (*)     RDW 16.0 (*)     Platelets 137 (*)     Monocytes Relative 18 (*)     Eosinophils Relative 8 (*)     All other components within normal limits  BASIC METABOLIC PANEL - Abnormal; Notable for the following:    Potassium 3.3 (*)     BUN 43 (*)     Creatinine, Ser 8.90 (*)     GFR  calc non Af Amer 6 (*)     GFR calc Af Amer 7 (*)     All other components within normal limits   Dg Chest 2 View  07/06/2012  *RADIOLOGY REPORT*  Clinical Data: 47 year old male with cough, congestion and fever.  CHEST - 2 VIEW  Comparison: 06/01/2010 chest radiograph  Findings: Cardiomegaly and peribronchial thickening again noted. Left lower lobe opacity is noted may represent airspace disease/pneumonia.  There is no evidence of pulmonary edema, suspicious pulmonary nodule/mass, pleural effusion, or pneumothorax. No acute bony abnormalities are identified.  IMPRESSION: Left lower lobe opacity - question airspace disease/pneumonia. Radiographic follow up to resolution recommended.  Cardiomegaly.   Original Report Authenticated By: Harmon Pier, M.D.      1. HCAP (healthcare-associated pneumonia)   2. Chronic back pain     Patient seen and examined. CXR reviewed. Work-up initiated. D/w Dr. Lynelle Doctor. HCAP medications ordered. Pharmacy consulted.   Vital signs reviewed and are as follows: Filed Vitals:   07/06/12 2025  BP: 163/88  Pulse: 81  Temp: 98.3 F (36.8 C)  Resp: 20   Will d/c to home with abx (500mg  avelox, q48h x 10 days).   No red flag s/s of low back pain. Patient was counseled on back pain precautions and told to do activity as tolerated but do not lift, push, or pull heavy objects more than 10 pounds for the next week.  Patient counseled to use ice or heat on back for no longer than 15 minutes every hour.   Patient prescribed muscle relaxer and counseled on proper use of muscle relaxant medication.    Patient prescribed narcotic pain medicine and counseled on proper use of narcotic pain medications. Counseled not to combine this medication with others containing tylenol.   Urged patient not to drink alcohol, drive, or perform any other activities that requires focus while taking either of these medications.  Patient urged to follow-up with PCP if pain does not improve with  treatment and rest or if pain becomes recurrent. Urged to return with worsening severe pain, loss of bowel or bladder control, trouble walking.   The patient verbalizes understanding and agrees with the plan.  Patient counseled on use of narcotic pain medications. Counseled not to combine these medications with others containing tylenol. Urged not to drink alcohol, drive, or perform any other activities that requires focus while taking these medications. The patient verbalizes understanding and agrees with the plan.     MDM  HCAP due to dialysis status. Patient otherwise appears very well. WBC=5k, vitals normal.   Chronic back pain at baseline, no red flags. Pt requests pain medication.         Renne Crigler, [place] 07/06/12 2348

## 2012-07-06 NOTE — ED Notes (Addendum)
Pt. C/o cough times 3-4 days. States prod cough brown in color.  C/o chills. Fevers unknown. C/o congestion. Pain in ant chest/rib cage area with inspiration.resp even and unlabored at triage. Pt. Is a dialysis pt. [id number]

## 2012-07-06 NOTE — ED Notes (Signed)
Patient states he has cold with rib soreness from cough, patient stated he is kidney transplant patient, now out of meds as his Dr. Christen Bame not take medicaid patient, states is out of ambien and not sleeping well.

## 2012-07-06 NOTE — ED Provider Notes (Signed)
Medical screening examination/treatment/procedure(s) were conducted as a shared visit with non-physician practitioner(s) and myself.  I personally evaluated the patient during the encounter  Patient has been having symptoms of cough, chills for the last 2 days. History of chronic renal failure and is on dialysis Monday Wednesday Friday. Patient states otherwise he has felt generally well without nausea vomiting or poor appetite.  Chest x-ray suggests left lower lobe pneumonia. Patient will be started on antibiotics to cover for healthcare associated pneumonia. Prefer to go home he does not appear ill. Discharged course of Avelox. He will have close outpatient followup. He is scheduled for dialysis on Monday.  Celene Kras, MD 07/06/12 [phone]

## 2012-07-06 NOTE — ED Notes (Signed)
Unsuccessful attempt at blood draw in patient right hand.

## 2012-07-07 MED ORDER — VANCOMYCIN HCL IN DEXTROSE 1-5 GM/200ML-% IV SOLN
INTRAVENOUS | Status: AC
  Administered 2012-07-07: 2 g via INTRAVENOUS
  Filled 2012-07-07: qty 400

## 2012-07-07 MED ORDER — DIPHENHYDRAMINE HCL 50 MG/ML IJ SOLN
INTRAMUSCULAR | Status: AC
  Administered 2012-07-07: 25 mg via INTRAVENOUS
  Filled 2012-07-07: qty 1

## 2012-07-07 MED ORDER — DIPHENHYDRAMINE HCL 50 MG/ML IJ SOLN
25.0000 mg | Freq: Once | INTRAMUSCULAR | Status: AC
  Administered 2012-07-07: 25 mg via INTRAVENOUS

## 2012-07-07 MED ORDER — CEFEPIME HCL 1 G IJ SOLR
INTRAMUSCULAR | Status: AC
  Filled 2012-07-07: qty 1

## 2012-07-10 NOTE — Progress Notes (Signed)
[facility] ED Cm consulted by flow manager about medication concerns Recommendations offered to check with pt pcp Informed pcp is [facility] Epic updated Pending any further responses

## 2012-07-12 NOTE — Progress Notes (Signed)
[facility] ED CM spoke with CM leadership, flow manager and called pt at [phone] There was not an answer Cm called [phone] and the pt's wife assisted with connecting the pt and CM via another telephone number Pt reports being able to get all of the medications he needs, therefore the medication issues are resolved.  Pt spoke with Cm about not being able to getting a provider/doctor for his pain medications Pt referred back to DSS to see if an available pain management doctor is listed in the medicaid program for him or if they could recommend one.  Pt and his wife voiced understanding and appreciation of CM's assistance and call

## 2013-01-01 ENCOUNTER — Emergency Department (HOSPITAL_BASED_OUTPATIENT_CLINIC_OR_DEPARTMENT_OTHER): Payer: No Typology Code available for payment source

## 2013-01-01 ENCOUNTER — Emergency Department (HOSPITAL_BASED_OUTPATIENT_CLINIC_OR_DEPARTMENT_OTHER)
Admission: EM | Admit: 2013-01-01 | Discharge: 2013-01-02 | Disposition: A | Discharge status: Home / Self Care | Payer: No Typology Code available for payment source | Attending: Emergency Medicine | Admitting: Emergency Medicine

## 2013-01-01 ENCOUNTER — Encounter (HOSPITAL_BASED_OUTPATIENT_CLINIC_OR_DEPARTMENT_OTHER): Payer: Self-pay

## 2013-01-01 DIAGNOSIS — Z79899 Other long term (current) drug therapy: Secondary | ICD-10-CM | POA: Insufficient documentation

## 2013-01-01 DIAGNOSIS — S46909A Unspecified injury of unspecified muscle, fascia and tendon at shoulder and upper arm level, unspecified arm, initial encounter: Secondary | ICD-10-CM | POA: Insufficient documentation

## 2013-01-01 DIAGNOSIS — I129 Hypertensive chronic kidney disease with stage 1 through stage 4 chronic kidney disease, or unspecified chronic kidney disease: Secondary | ICD-10-CM | POA: Insufficient documentation

## 2013-01-01 DIAGNOSIS — M533 Sacrococcygeal disorders, not elsewhere classified: Secondary | ICD-10-CM

## 2013-01-01 DIAGNOSIS — Z992 Dependence on renal dialysis: Secondary | ICD-10-CM | POA: Insufficient documentation

## 2013-01-01 DIAGNOSIS — N189 Chronic kidney disease, unspecified: Secondary | ICD-10-CM | POA: Insufficient documentation

## 2013-01-01 DIAGNOSIS — Z862 Personal history of diseases of the blood and blood-forming organs and certain disorders involving the immune mechanism: Secondary | ICD-10-CM | POA: Insufficient documentation

## 2013-01-01 DIAGNOSIS — S4992XA Unspecified injury of left shoulder and upper arm, initial encounter: Secondary | ICD-10-CM

## 2013-01-01 DIAGNOSIS — Z8639 Personal history of other endocrine, nutritional and metabolic disease: Secondary | ICD-10-CM | POA: Insufficient documentation

## 2013-01-01 DIAGNOSIS — IMO0002 Reserved for concepts with insufficient information to code with codable children: Secondary | ICD-10-CM | POA: Insufficient documentation

## 2013-01-01 DIAGNOSIS — Z7982 Long term (current) use of aspirin: Secondary | ICD-10-CM | POA: Insufficient documentation

## 2013-01-01 DIAGNOSIS — K219 Gastro-esophageal reflux disease without esophagitis: Secondary | ICD-10-CM | POA: Insufficient documentation

## 2013-01-01 DIAGNOSIS — S4980XA Other specified injuries of shoulder and upper arm, unspecified arm, initial encounter: Secondary | ICD-10-CM | POA: Insufficient documentation

## 2013-01-01 DIAGNOSIS — Y9389 Activity, other specified: Secondary | ICD-10-CM | POA: Insufficient documentation

## 2013-01-01 DIAGNOSIS — Z940 Kidney transplant status: Secondary | ICD-10-CM | POA: Insufficient documentation

## 2013-01-01 DIAGNOSIS — Y92410 Unspecified street and highway as the place of occurrence of the external cause: Secondary | ICD-10-CM | POA: Insufficient documentation

## 2013-01-01 DIAGNOSIS — Z87891 Personal history of nicotine dependence: Secondary | ICD-10-CM | POA: Insufficient documentation
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
## 2013-01-01 NOTE — ED Notes (Signed)
Pt to desk stating he needs something for pain. Explained to pt he would have to be evaluated by MD before receiving any pain medication. Apologized for wait.

## 2013-01-01 NOTE — ED Notes (Signed)
MD at bedside with this RN present.
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
## 2013-01-01 NOTE — ED Notes (Signed)
According to state data base pt filled rx for 90 percocet and 60 OxyContin on 12/28/12. Pt asking MD for pain medication. MD explained to pt we would not be giving him pain medication here since he was in pain management and has contract with pain management.
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
## 2013-01-02 MED ORDER — OXYCODONE-ACETAMINOPHEN 5-325 MG PO TABS
2.0000 | ORAL_TABLET | Freq: Once | ORAL | Status: DC
  Filled 2013-01-02 (×2): qty 2

## 2013-01-02 NOTE — ED Notes (Signed)
Pt was ordered percocet for pain. Pt stated his wife was driving him home. Unable to locate wife in department, waiting room or parking lot. Informed pt if he could contact wife and have her come in to department so I could verify he had a driver I could give him the percocet. Pt said "never mind, I'll take my pain medicine I got at home." pt ambulatory out of department without difficulty.

## 2013-12-03 ENCOUNTER — Emergency Department (HOSPITAL_COMMUNITY)
Admission: EM | Admit: 2013-12-03 | Discharge: 2013-12-03 | Disposition: A | Discharge status: Home / Self Care | Payer: Medicare Other | Attending: Emergency Medicine | Admitting: Emergency Medicine

## 2013-12-03 ENCOUNTER — Encounter (HOSPITAL_COMMUNITY): Payer: Self-pay | Admitting: Emergency Medicine

## 2013-12-03 DIAGNOSIS — I120 Hypertensive chronic kidney disease with stage 5 chronic kidney disease or end stage renal disease: Secondary | ICD-10-CM | POA: Insufficient documentation

## 2013-12-03 DIAGNOSIS — E875 Hyperkalemia: Secondary | ICD-10-CM

## 2013-12-03 DIAGNOSIS — K219 Gastro-esophageal reflux disease without esophagitis: Secondary | ICD-10-CM | POA: Insufficient documentation

## 2013-12-03 DIAGNOSIS — N186 End stage renal disease: Secondary | ICD-10-CM | POA: Insufficient documentation

## 2013-12-03 DIAGNOSIS — M25519 Pain in unspecified shoulder: Secondary | ICD-10-CM | POA: Insufficient documentation

## 2013-12-03 DIAGNOSIS — Z79899 Other long term (current) drug therapy: Secondary | ICD-10-CM | POA: Insufficient documentation

## 2013-12-03 DIAGNOSIS — Z992 Dependence on renal dialysis: Secondary | ICD-10-CM | POA: Insufficient documentation

## 2013-12-03 DIAGNOSIS — Z792 Long term (current) use of antibiotics: Secondary | ICD-10-CM | POA: Insufficient documentation

## 2013-12-03 DIAGNOSIS — Z87891 Personal history of nicotine dependence: Secondary | ICD-10-CM | POA: Insufficient documentation

## 2013-12-03 DIAGNOSIS — R5381 Other malaise: Secondary | ICD-10-CM | POA: Insufficient documentation

## 2013-12-03 DIAGNOSIS — R5383 Other fatigue: Secondary | ICD-10-CM

## 2013-12-03 DIAGNOSIS — Z7982 Long term (current) use of aspirin: Secondary | ICD-10-CM | POA: Insufficient documentation

## 2013-12-03 LAB — CBC
HEMATOCRIT: 38.1 % — AB (ref 39.0–52.0)
HEMOGLOBIN: 12.4 g/dL — AB (ref 13.0–17.0)
MCH: 33.3 pg (ref 26.0–34.0)
MCHC: 32.5 g/dL (ref 30.0–36.0)
MCV: 102.4 fL — ABNORMAL HIGH (ref 78.0–100.0)
Platelets: 131 10*3/uL — ABNORMAL LOW (ref 150–400)
RBC: 3.72 MIL/uL — ABNORMAL LOW (ref 4.22–5.81)
RDW: 15.6 % — ABNORMAL HIGH (ref 11.5–15.5)
WBC: 3.6 10*3/uL — ABNORMAL LOW (ref 4.0–10.5)

## 2013-12-03 LAB — COMPREHENSIVE METABOLIC PANEL
ALT: 18 U/L (ref 0–53)
AST: 27 U/L (ref 0–37)
Albumin: 4.5 g/dL (ref 3.5–5.2)
Alkaline Phosphatase: 115 U/L (ref 39–117)
BUN: 45 mg/dL — AB (ref 6–23)
CALCIUM: 9.1 mg/dL (ref 8.4–10.5)
CO2: 24 mEq/L (ref 19–32)
Chloride: 100 mEq/L (ref 96–112)
Creatinine, Ser: 12.29 mg/dL — ABNORMAL HIGH (ref 0.50–1.35)
GFR calc non Af Amer: 4 mL/min — ABNORMAL LOW (ref >90)
GFR, EST AFRICAN AMERICAN: 5 mL/min — AB (ref >90)
GLUCOSE: 76 mg/dL (ref 70–99)
Potassium: 5.9 mEq/L — ABNORMAL HIGH (ref 3.7–5.3)
SODIUM: 145 meq/L (ref 137–147)
TOTAL PROTEIN: 8.8 g/dL — AB (ref 6.0–8.3)
Total Bilirubin: 0.4 mg/dL (ref 0.3–1.2)

## 2013-12-03 LAB — CBG MONITORING, ED: Glucose-Capillary: 76 mg/dL (ref 70–99)

## 2013-12-03 MED ORDER — SODIUM POLYSTYRENE SULFONATE 15 GM/60ML PO SUSP
30.0000 g | Freq: Once | ORAL | Status: AC
  Administered 2013-12-03: 30 g via ORAL
  Filled 2013-12-03: qty 120

## 2013-12-03 NOTE — ED Notes (Signed)
Spoke to [facility], pt was dialyzed yesterday and appointment is now rescheduled for tomorrow at 0630. Will d/c patient to home.

## 2013-12-03 NOTE — ED Notes (Signed)
Pt ambulated in hallway approximately 50ft.  

## 2013-12-03 NOTE — ED Notes (Signed)
ED EKG completed.

## 2013-12-03 NOTE — ED Notes (Signed)
Per EMS, pt took Flexaril and Benadryl this morning before driving to dialysis. Once he got there, staff did not perform dialysis. Pt is lethargic but easy to arouse on arrival. Pt a&o X4.

## 2013-12-03 NOTE — Discharge Instructions (Signed)
Hyperkalemia Hyperkalemia is when you have too much potassium in your blood. This can be a life-threatening condition. Potassium is normally removed (excreted) from the body by the kidneys. CAUSES  The potassium level in your body can become too high for the following reasons:  You take in too much potassium. You can do this by:  Using salt substitutes. They contain large amounts of potassium.  Taking potassium supplements from your caregiver. The dose may be too high for you.  Eating foods or taking nutritional products with potassium.  You excrete too little potassium. This can happen if:  Your kidneys are not functioning properly. Kidney (renal) disease is a very common cause of hyperkalemia.  You are taking medicines that lower your excretion of potassium, such as certain diuretic medicines.  You have an adrenal gland disease called Addison's disease.  You have a urinary tract obstruction, such as kidney stones.  You are on treatment to mechanically clean your blood (dialysis) and you skip a treatment.  You release a high amount of potassium from your cells into your blood. You may have a condition that causes potassium to move from your cells to your bloodstream. This can happen with:  Injury to muscles or other tissues. Most potassium is stored in the muscles.  Severe burns or infections.  Acidic blood plasma (acidosis). Acidosis can result from many diseases, such as uncontrolled diabetes. SYMPTOMS  Usually, there are no symptoms unless the potassium is dangerously high or has risen very quickly. Symptoms may include:  Irregular or very slow heartbeat.  Feeling sick to your stomach (nauseous).  Tiredness (fatigue).  Nerve problems such as tingling of the skin, numbness of the hands or feet, weakness, or paralysis. DIAGNOSIS  A simple blood test can measure the amount of potassium in your body. An electrocardiogram test of the heart can also help make the diagnosis.  The heart may beat dangerously fast or slow down and stop beating with severe hyperkalemia.  TREATMENT  Treatment depends on how bad the condition is and on the underlying cause.  If the hyperkalemia is an emergency (causing heart problems or paralysis), many different medicines can be used alone or together to lower the potassium level briefly. This may include an insulin injection even if you are not diabetic. Emergency dialysis may be needed to remove potassium from the body.  If the hyperkalemia is less severe or dangerous, the underlying cause is treated. This can include taking medicines if needed. Your prescription medicines may be changed. You may also need to take a medicine to help your body get rid of potassium. You may need to eat a diet low in potassium. HOME CARE INSTRUCTIONS   Take medicines and supplements as directed by your caregiver.  Do not take any over-the-counter medicines, supplements, natural products, herbs, or vitamins without reviewing them with your caregiver. Certain supplements and natural food products can have high amounts of potassium. Other products (such as ibuprofen) can damage weak kidneys and raise your potassium.  You may be asked to do repeat lab tests. Be sure to follow these directions.  If you have kidney disease, you may need to follow a low potassium diet. SEEK MEDICAL CARE IF:   You notice an irregular or very slow heartbeat.  You feel lightheaded.  You develop weakness that is unusual for you. SEEK IMMEDIATE MEDICAL CARE IF:   You have shortness of breath.  You have chest discomfort.  You pass out (faint). MAKE SURE YOU:   Understand  these instructions.  Will watch your condition.  Will get help right away if you are not doing well or get worse. Document Released: 05/26/2002 Document Revised: 08/28/2011 Document Reviewed: 11/10/2010 [facility] Patient Information 2015 [place], [place]. This information is not intended to replace  advice given to you by your health care provider. Make sure you discuss any questions you have with your health care provider.  Hemodialysis Dialysis is a procedure that replaces some of the work that healthy kidneys do. It is done when you lose about 85 90% of your kidney function and sometimes earlier if your symptoms may be improved by dialysis. During dialysis, wastes, salt, and extra water are removed from the blood; and the level of certain chemicals in the blood (such as potassium) is maintained. Hemodialysis is a type of dialysis in which a machine called a dialyzer is used to filter the blood. Hemodialysis is usually performed by a caregiver at a hospital or dialysis center 3 times a week for 3 5 hours during each visit. It may also be performed more frequently and for longer periods of time at home with training. LET YOUR CAREGIVER KNOW ABOUT:  Any allergies you have.  All medications you are taking, including vitamins, herbs, eyedrops, and over-the-counter medications and creams.  Any blood disorders you have had.  Other health problems you have. RISKS AND COMPLICATIONS Generally, hemodialysis is a safe procedure. However, as with any procedure, complications can occur. Possible complications include:  Low blood pressure (hypotension).  Narrowing or ballooning of a blood vessel or bleeding at the site where blood is removed from the body and returned to the body (vascular access).  An infection or blockage at the vascular access site. BEFORE THE PROCEDURE Before having hemodialysis for the first time, you will need surgery to create a site where blood can be removed from the body and returned to the body (vascular access). There are three types of vascular accesses:  Arteriovenous fistula. This type of access is created when an artery and a vein (usually in the arm) are connected during surgery. The arteriovenous fistula takes 1 6 months to develop after surgery.  Arteriovenous  graft. This type of access is created when an artery and a vein in the arm are connected during surgery with a tube. An arteriovenous graft can be used within 2 3 weeks of surgery.  A venous catheter. To create this type of access, a thin, flexible tube (catheter) is placed in a large vein in your neck, chest, or groin. A venous catheter can be used right away. PROCEDURE  Hemodialysis is performed while you are sitting or reclining. During the procedure, you may sleep, read, watch television, or perform any other tasks that can be done while you are in this position. If you experience side effects or any other discomfort during the procedure, let your caregiver know. He or she may be able to make adjustments to help you feel more comfortable. Your hemodialysis process may look something like this: 1. Your weight, blood pressure, pulse, and temperature will be measured. 2. The skin around your vascular access will be sterilized. 3. Your vascular access will be connected to the dialyzer. If your access is a fistula or graft, two needles will be inserted through the vascular access. The needles will be connected to a plastic tube that is connected to the dialyzer. They will be taped to your skin so that they do not move out of place. If your access is a catheter, it  will be connected to a plastic tube that is connected to the dialyzer. 4. The dialysis machine will be turned on. This will cause your blood to flow to the dialyzer. The dialyzer will filter and clean your blood before returning it to your body. While the dialysis machine is working, your blood pressure and pulse will be checked several times. 5. Once dialysis is complete, your vascular access will be disconnected from the dialyzer. If your access is a fistula or graft, the needles will be removed and a bandage (dressing) will be placed over the access. AFTER THE PROCEDURE  Your weight will be measured.  Your blood may be tested to see  whether your treatments are removing enough wastes. This is usually done once a month.  You may experience or continue to experience side effects, including:  Dizziness.  Muscle cramps.  Nausea.  Headaches.  Feeling tired. Energy levels usually return to normal the day after the procedure.  Itchiness. Your caregiver may be able to prescribe medication to relieve it.  Achy or jittery legs. You may feel like kicking your legs. This sometimes causes sleeping problems.  Allergic reaction to the chemicals used to sterilize the dialyzer. Document Released: 09/30/2012 Document Reviewed: 09/30/2012 [facility] Patient Information 2014 [place], [place]LL[place].  Weakness Weakness is a lack of strength. It may be felt all over the body (generalized) or in one specific part of the body (focal). Some causes of weakness can be serious. You may need further medical evaluation, especially if you are elderly or you have a history of immunosuppression (such as chemotherapy or HIV), kidney disease, heart disease, or diabetes. CAUSES  Weakness can be caused by many different things, including:  Infection.  Physical exhaustion.  Internal bleeding or other blood loss that results in a lack of red blood cells (anemia).  Dehydration. This cause is more common in elderly people.  Side effects or electrolyte abnormalities from medicines, such as pain medicines or sedatives.  Emotional distress, anxiety, or depression.  Circulation problems, especially severe peripheral arterial disease.  Heart disease, such as rapid atrial fibrillation, bradycardia, or heart failure.  Nervous system disorders, such as Guillain-Barr syndrome, multiple sclerosis, or stroke. DIAGNOSIS  To find the cause of your weakness, your caregiver will take your history and perform a physical exam. Lab tests or X-rays may also be ordered, if needed. TREATMENT  Treatment of weakness depends on the cause of your symptoms and can vary  greatly. HOME CARE INSTRUCTIONS   Rest as needed.  Eat a well-balanced diet.  Try to get some exercise every day.  Only take over-the-counter or prescription medicines as directed by your caregiver. SEEK MEDICAL CARE IF:   Your weakness seems to be getting worse or spreads to other parts of your body.  You develop new aches or pains. SEEK IMMEDIATE MEDICAL CARE IF:   You cannot perform your normal daily activities, such as getting dressed and feeding yourself.  You cannot walk up and down stairs, or you feel exhausted when you do so.  You have shortness of breath or chest pain.  You have difficulty moving parts of your body.  You have weakness in only one area of the body or on only one side of the body.  You have a fever.  You have trouble speaking or swallowing.  You cannot control your bladder or bowel movements.  You have black or bloody vomit or stools. MAKE SURE YOU:  Understand these instructions.  Will watch your condition.  Will  get help right away if you are not doing well or get worse. Document Released: 06/05/2005 Document Revised: 12/05/2011 Document Reviewed: 08/04/2011 [facility] Patient Information 2015 [place], [place]. This information is not intended to replace advice given to you by your health care provider. Make sure you discuss any questions you have with your health care provider.

## 2013-12-03 NOTE — ED Provider Notes (Signed)
CSN: [id number]     Arrival date & time 12/03/13  0704 History   First MD Initiated Contact with Patient 12/03/13 [phone]     Chief Complaint  Patient presents with  . Altered Mental Status     (Consider location/radiation/quality/duration/timing/severity/associated sxs/prior Treatment) HPI  48 year old male transferred from his dialysis center for evaluation of decreased mental status. Patient has no acute complaints. He does appear very drowsy, but states "I stay tired." When asked why he thinks he was sent from dialysis, patient replied that he was staggering. He says he was doing this which is not fully awake yet. Hecomplaints of pain in his left shoulder. This is not acute. Patient did take narcotic pain medicine this morning also and Benadryl for itching. No respiratory complaints. No fevers or chills. He denies any trauma. He denies taking any medicines inappropriately. No headaches or neck pain/stiffness. States last dialysis on Monday.   Past Medical History  Diagnosis Date  . Hypertension   . Reflux   . Gout   . Chronic renal failure     S/P renal transplant  . S/P kidney transplant   . Dialysis patient    Past Surgical History  Procedure Laterality Date  . Kidney transplant    . Av fistula placement      left arm  . Hernia repair    . Nasal fracture surgery     History reviewed. No pertinent family history. History  Substance Use Topics  . Smoking status: Former Games developermoker  . Smokeless tobacco: Never Used  . Alcohol Use: No    Review of Systems  All systems reviewed and negative, other than as noted in HPI.   Allergies  Ivp dye  Home Medications   Prior to Admission medications   Medication Sig Start Date End Date Taking? Authorizing [place]  amLODipine (NORVASC) 10 MG tablet Take 10 mg by mouth daily.      Historical Tallulah Hosman, MD  AMLODIPINE BESY-BENAZEPRIL HCL PO Take 10 mg by mouth.    Historical Mykeal Carrick, MD  aspirin 81 MG tablet Take 81 mg by mouth  daily.    Historical Marye Eagen, MD  carvedilol (COREG) 25 MG tablet Take 25 mg by mouth 2 (two) times daily with a meal.    Historical Emmelyn Schmale, MD  cinacalcet (SENSIPAR) 30 MG tablet Take 30 mg by mouth daily.    Historical Shoichi Mielke, MD  cloNIDine (CATAPRES) 0.3 MG tablet Take 0.3 mg by mouth 3 (three) times daily.     Historical Sharleen Szczesny, MD  docusate sodium (COLACE) 100 MG capsule Take 100 mg by mouth as needed.    Historical Briggitte Boline, MD  esomeprazole (NEXIUM) 40 MG capsule Take 40 mg by mouth daily.      Historical Sharnese Heath, MD  levofloxacin (LEVAQUIN) 500 MG tablet Take 1 tablet (500 mg total) by mouth every other day. 07/06/12   Renne CriglerJoshua Geiple, PA-C  losartan (COZAAR) 100 MG tablet Take 100 mg by mouth daily.    Historical Paw Karstens, MD  minoxidil (LONITEN) 2.5 MG tablet Take 2.5 mg by mouth daily.    Historical Alexander Aument, MD  nitroGLYCERIN (NITROSTAT) 0.4 MG SL tablet Place 0.4 mg under the tongue every 5 (five) minutes as needed.    Historical Daune Divirgilio, MD  oxyCODONE (OXYCONTIN) 40 MG 12 hr tablet Take 40 mg by mouth every 12 (twelve) hours.    Historical Tahari Clabaugh, MD  oxyCODONE-acetaminophen (PERCOCET) 10-325 MG per tablet Take 1 tablet by mouth every 4 (four) hours as needed.    Historical  Elliet Goodnow, MD  oxyCODONE-acetaminophen (PERCOCET/ROXICET) 5-325 MG per tablet Take 1-2 tablets by mouth every 6 (six) hours as needed for pain. 07/06/12   Renne CriglerJoshua Geiple, PA-C  oxyCODONE-acetaminophen (PERCOCET/ROXICET) 5-325 MG per tablet Take 1 tablet by mouth every 4 (four) hours as needed for pain.    Historical Richele Strand, MD  prednisoLONE 5 MG TABS Take 2 mg by mouth daily.    Historical Georgio Hattabaugh, MD  sevelamer carbonate (RENVELA) 800 MG tablet Take 800 mg by mouth 3 (three) times daily with meals.    Historical Yang Rack, MD  TORSEMIDE PO Take 40 mg by mouth 2 (two) times daily.    Historical Jameison Haji, MD  zolpidem (AMBIEN) 10 MG tablet Take 1 tablet (10 mg total) by mouth at bedtime as needed for sleep.  10/17/11 01/01/13  Geoffery Lyonsouglas Delo, MD   BP 149/85  Pulse 61  Temp(Src) 97.7 F (36.5 C) (Oral)  Resp 15  Ht 6\' 1"  (1.854 m)  Wt 244 lb (110.678 kg)  BMI 32.20 kg/m2  SpO2 96% Physical Exam  Nursing note and vitals reviewed. Constitutional: He appears well-developed and well-nourished. No distress.  HENT:  Head: Normocephalic and atraumatic.  Eyes: Conjunctivae are normal. Right eye exhibits no discharge. Left eye exhibits no discharge.  Neck: Neck supple.  Cardiovascular: Normal rate, regular rhythm and normal heart sounds.  Exam reveals no gallop and no friction rub.   No murmur heard. Pulmonary/Chest: Effort normal and breath sounds normal. No respiratory distress.  Abdominal: Soft. He exhibits no distension. There is no tenderness.  Musculoskeletal: He exhibits no edema and no tenderness.  Neurological: No cranial nerve deficit. He exhibits normal muscle tone.  Snoring when I entered room. Awokened to loud voice. Speech slow but intelligible. Answering questions appropriately but keeping eyes closed while talking. CN intact. Moving all extremities equally. Had to really encourage pt to do finger to nose because he kept trying to close eyes, but no dysmetria.  Skin: Skin is warm and dry.  Psychiatric: He has a normal mood and affect. His behavior is normal. Thought content normal.    ED Course  Procedures (including critical care time) Labs Review Labs Reviewed  CBC - Abnormal; Notable for the following:    WBC 3.6 (*)    RBC 3.72 (*)    Hemoglobin 12.4 (*)    HCT 38.1 (*)    MCV 102.4 (*)    RDW 15.6 (*)    Platelets 131 (*)    All other components within normal limits  COMPREHENSIVE METABOLIC PANEL - Abnormal; Notable for the following:    Potassium 5.9 (*)    BUN 45 (*)    Creatinine, Ser 12.29 (*)    Total Protein 8.8 (*)    GFR calc non Af Amer 4 (*)    GFR calc Af Amer 5 (*)    All other components within normal limits  CBG MONITORING, ED  CBG MONITORING, ED     Imaging Review No results found.   EKG Interpretation   Date/Time:  Wednesday December 03 2013 07:12:24 EDT Ventricular Rate:  61 PR Interval:  237 QRS Duration: 93 QT Interval:  451 QTC Calculation: 454 R Axis:   24 Text Interpretation:  Sinus rhythm Prolonged PR interval Abnormal T,  consider ischemia, lateral leads Confirmed by Juleen ChinaKOHUT  MD, STEPHEN ([id number]) on  12/03/2013 10:34:57 AM      MDM   Final diagnoses:  Tiredness  Hyperkalemia  ESRD (end stage renal disease) on dialysis    48  y male with decreased mental status. He is very drowsy, otherwise acting appropriate. He does answer my questions. His speech is clear and content is appropriate. He quickly falls asleep when not engaged though. Aside from being tired, his neurological examination is nonfocal. He is afebrile. HD stable.  He does not have any acute complaints. My suspicion is that he is oversedated secondary to medications. Will obtain some screening exams and observe. With otherwise normal neuro exam, CT deferred.   Patient now more awake. Cannulated prior to discharge. In light of known ESRD, workup unremarkable aside from hyperkalemia. Given Kayexalate. Dialysis center was called. Patient can go at 6:30 tomorrow morning. Return precautions were discussed.        Raeford Razor, MD 12/03/13 [phone]

## 2015-02-16 NOTE — Patient Outreach (Signed)
[facility]) Care Management  02/16/2015  BASTIEN STRAWSER Oct 06, 1965 [id number]   Referral from High Risk List, assigned Tyler Deis, RN to outreach.  Thanks, Corrie Mckusick. Sharlee Blew [facility] Care Management [facility] CM Assistant Phone: [phone] Fax: [phone]

## 2015-03-02 ENCOUNTER — Other Ambulatory Visit: Payer: Self-pay

## 2015-03-02 NOTE — Patient Outreach (Signed)
[facility]) Care Management  03/02/2015  Nicholas Green February 14, 1966 [id number]   Unsuccessful attempt to reach patient.  No answer at either number listed in record.  Will attempt another contact within 5 working days.  Tyler Deis, RN, MSN RN [employer] [phone] Fax [phone]

## 2015-03-08 ENCOUNTER — Other Ambulatory Visit: Payer: Self-pay

## 2015-03-08 NOTE — Patient Outreach (Signed)
[facility]) Care Management  03/08/2015  Nicholas Green 08-Apr-1966 [id number]   Second unsuccessful attempt to reach patient by phone.  Tyler Deis, RN, MSN RN [employer] [phone] Fax [phone]

## 2015-03-16 ENCOUNTER — Other Ambulatory Visit: Payer: Self-pay

## 2015-03-16 NOTE — Patient Outreach (Signed)
[facility]) Care Management  03/16/2015  Nicholas Green 1966/01/08 [id number]   Third unsuccessful attempt to reach patient referred from High Risk list. Health Coach will mail letter to patient requesting call back.  Tyler Deis, RN, MSN RN [employer] [phone] Fax [phone]

## 2015-04-14 NOTE — Patient Outreach (Signed)
[facility] Care Management  04/14/2015  Nicholas SpineRicky R Green [birth year] [id number]   Notification from Tyler Deisonnie Rankin, RN to close case due to unable to contact patient for [facility] Care [facility] services.  Thanks, Corrie MckusickLisa O. Sharlee BlewMoore, [facility] Care Management [facility] CM Assistant Phone: [phone] Fax: [phone]

## 2016-07-04 ENCOUNTER — Other Ambulatory Visit (HOSPITAL_BASED_OUTPATIENT_CLINIC_OR_DEPARTMENT_OTHER): Payer: Self-pay | Admitting: Internal Medicine

## 2016-07-04 DIAGNOSIS — R748 Abnormal levels of other serum enzymes: Secondary | ICD-10-CM

## 2016-07-06 ENCOUNTER — Ambulatory Visit (HOSPITAL_BASED_OUTPATIENT_CLINIC_OR_DEPARTMENT_OTHER): Payer: Medicare Other

## 2016-07-10 ENCOUNTER — Other Ambulatory Visit (HOSPITAL_BASED_OUTPATIENT_CLINIC_OR_DEPARTMENT_OTHER): Payer: Medicare Other

## 2016-07-11 ENCOUNTER — Ambulatory Visit (HOSPITAL_BASED_OUTPATIENT_CLINIC_OR_DEPARTMENT_OTHER)
Admission: RE | Admit: 2016-07-11 | Discharge: 2016-07-11 | Disposition: A | Discharge status: Home / Self Care | Payer: Medicare Other | Source: Ambulatory Visit | Attending: Internal Medicine | Admitting: Internal Medicine

## 2016-07-11 DIAGNOSIS — R748 Abnormal levels of other serum enzymes: Secondary | ICD-10-CM | POA: Diagnosis not present

## 2016-10-17 ENCOUNTER — Emergency Department (HOSPITAL_BASED_OUTPATIENT_CLINIC_OR_DEPARTMENT_OTHER)
Admission: EM | Admit: 2016-10-17 | Discharge: 2016-10-17 | Disposition: A | Discharge status: Home / Self Care | Payer: Medicare Other | Attending: Emergency Medicine | Admitting: Emergency Medicine

## 2016-10-17 ENCOUNTER — Encounter (HOSPITAL_BASED_OUTPATIENT_CLINIC_OR_DEPARTMENT_OTHER): Payer: Self-pay | Admitting: *Deleted

## 2016-10-17 DIAGNOSIS — Z7982 Long term (current) use of aspirin: Secondary | ICD-10-CM | POA: Diagnosis not present

## 2016-10-17 DIAGNOSIS — I129 Hypertensive chronic kidney disease with stage 1 through stage 4 chronic kidney disease, or unspecified chronic kidney disease: Secondary | ICD-10-CM | POA: Diagnosis not present

## 2016-10-17 DIAGNOSIS — M549 Dorsalgia, unspecified: Secondary | ICD-10-CM | POA: Insufficient documentation

## 2016-10-17 DIAGNOSIS — I10 Essential (primary) hypertension: Secondary | ICD-10-CM

## 2016-10-17 DIAGNOSIS — Z87891 Personal history of nicotine dependence: Secondary | ICD-10-CM | POA: Diagnosis not present

## 2016-10-17 DIAGNOSIS — N189 Chronic kidney disease, unspecified: Secondary | ICD-10-CM | POA: Insufficient documentation

## 2016-10-17 DIAGNOSIS — G8929 Other chronic pain: Secondary | ICD-10-CM

## 2016-10-17 DIAGNOSIS — E875 Hyperkalemia: Secondary | ICD-10-CM

## 2016-10-17 DIAGNOSIS — Z79899 Other long term (current) drug therapy: Secondary | ICD-10-CM | POA: Diagnosis not present

## 2016-10-17 DIAGNOSIS — Z992 Dependence on renal dialysis: Secondary | ICD-10-CM | POA: Diagnosis not present

## 2016-10-17 LAB — BASIC METABOLIC PANEL
ANION GAP: 15 (ref 5–15)
BUN: 76 mg/dL — ABNORMAL HIGH (ref 6–20)
CO2: 25 mmol/L (ref 22–32)
Calcium: 8.8 mg/dL — ABNORMAL LOW (ref 8.9–10.3)
Chloride: 102 mmol/L (ref 101–111)
Creatinine, Ser: 15.89 mg/dL — ABNORMAL HIGH (ref 0.61–1.24)
GFR, EST AFRICAN AMERICAN: 3 mL/min — AB (ref >60)
GFR, EST NON AFRICAN AMERICAN: 3 mL/min — AB (ref >60)
Glucose, Bld: 86 mg/dL (ref 65–99)
POTASSIUM: 6.2 mmol/L — AB (ref 3.5–5.1)
SODIUM: 142 mmol/L (ref 135–145)

## 2016-10-17 MED ORDER — OXYCODONE-ACETAMINOPHEN 5-325 MG PO TABS: 1.0000 | ORAL_TABLET | Freq: Once | ORAL | Status: DC

## 2016-10-17 MED ORDER — OXYCODONE-ACETAMINOPHEN 5-325 MG PO TABS
2.0000 | ORAL_TABLET | Freq: Once | ORAL | Status: AC
  Administered 2016-10-17: 2 via ORAL
  Filled 2016-10-17: qty 2

## 2016-10-17 NOTE — ED Notes (Signed)
Patient called to triage, patient in the restroom at this time.

## 2016-10-17 NOTE — ED Notes (Signed)
Pt states he is unsure if he can find a ride home, given phone and call light to call this rn when he is able to find someone to drive him home.

## 2016-10-17 NOTE — ED Triage Notes (Signed)
Pt reports being out of his pain medication, his md "fired me last month and cut me loose" and he states he has apt in pain clinic in a few weeks. Saw his pcp this am and was told to come to ed for "pain shot."

## 2016-10-17 NOTE — ED Provider Notes (Addendum)
[facility] Provider Note   CSN: [id number] Arrival date & time: 10/17/16  1478     History   Chief Complaint Chief Complaint  Patient presents with  . Pain    HPI Nicholas Green is a 51 y.o. male.  Patient presents with 2 separate concerns. Patient's blood pressure was elevated today. Patient is a dialysis patient last dialyzed yesterday without difficulty. Patient is chronic high blood pressure is compliant amlodipine, losartan, clonidine and hydralazine. Patient denies short of breath or chest pain. Patient's chronic back pain is no different than previous. No new injuries. No fevers. Patient had a physician prescribing oxycodone however he set a month ago they stopped and his been turned to get into pain management which is an appointment and this month. Patient has been taking Tylenol.      Past Medical History:  Diagnosis Date  . Chronic renal failure    S/P renal transplant  . Dialysis patient (HCC)   . Gout   . Hypertension   . Reflux   . S/P kidney transplant     There are no active problems to display for this patient.   Past Surgical History:  Procedure Laterality Date  . AV FISTULA PLACEMENT     left arm  . HERNIA REPAIR    . KIDNEY TRANSPLANT    . NASAL FRACTURE SURGERY         Home Medications    Prior to Admission medications   Medication Sig Start Date End Date Taking? Authorizing Provider  amLODipine (NORVASC) 10 MG tablet Take 10 mg by mouth daily.      Historical Provider, MD  AMLODIPINE BESY-BENAZEPRIL HCL PO Take 10 mg by mouth.    Historical Provider, MD  aspirin 81 MG tablet Take 81 mg by mouth daily.    Historical Provider, MD  carvedilol (COREG) 25 MG tablet Take 25 mg by mouth 2 (two) times daily with a meal.    Historical Provider, MD  cinacalcet (SENSIPAR) 30 MG tablet Take 30 mg by mouth daily.    Historical Provider, MD  cloNIDine (CATAPRES) 0.3 MG tablet Take 0.3 mg by mouth 3 (three) times daily.     Historical  Provider, MD  docusate sodium (COLACE) 100 MG capsule Take 100 mg by mouth as needed.    Historical Provider, MD  esomeprazole (NEXIUM) 40 MG capsule Take 40 mg by mouth daily.      Historical Provider, MD  levofloxacin (LEVAQUIN) 500 MG tablet Take 1 tablet (500 mg total) by mouth every other day. 07/06/12   Renne Crigler, PA-C  losartan (COZAAR) 100 MG tablet Take 100 mg by mouth daily.    Historical Provider, MD  minoxidil (LONITEN) 2.5 MG tablet Take 2.5 mg by mouth daily.    Historical Provider, MD  nitroGLYCERIN (NITROSTAT) 0.4 MG SL tablet Place 0.4 mg under the tongue every 5 (five) minutes as needed.    Historical Provider, MD  oxyCODONE (OXYCONTIN) 40 MG 12 hr tablet Take 40 mg by mouth every 12 (twelve) hours.    Historical Provider, MD  oxyCODONE-acetaminophen (PERCOCET) 10-325 MG per tablet Take 1 tablet by mouth every 4 (four) hours as needed.    Historical Provider, MD  oxyCODONE-acetaminophen (PERCOCET/ROXICET) 5-325 MG per tablet Take 1-2 tablets by mouth every 6 (six) hours as needed for pain. 07/06/12   Renne Crigler, PA-C  oxyCODONE-acetaminophen (PERCOCET/ROXICET) 5-325 MG per tablet Take 1 tablet by mouth every 4 (four) hours as needed for pain.  Historical Provider, MD  prednisoLONE 5 MG TABS Take 2 mg by mouth daily.    Historical Provider, MD  sevelamer carbonate (RENVELA) 800 MG tablet Take 800 mg by mouth 3 (three) times daily with meals.    Historical Provider, MD  TORSEMIDE PO Take 40 mg by mouth 2 (two) times daily.    Historical Provider, MD  zolpidem (AMBIEN) 10 MG tablet Take 1 tablet (10 mg total) by mouth at bedtime as needed for sleep. 10/17/11 01/01/13  Geoffery Lyons, MD    Family History History reviewed. No pertinent family history.  Social History Social History  Substance Use Topics  . Smoking status: Former Games developer  . Smokeless tobacco: Never Used  . Alcohol use No     Allergies   Ivp dye [iodinated diagnostic agents]   Review of Systems Review  of Systems  Constitutional: Negative for chills and fever.  HENT: Negative for congestion.   Eyes: Negative for visual disturbance.  Respiratory: Negative for shortness of breath.   Cardiovascular: Negative for chest pain.  Gastrointestinal: Negative for abdominal pain and vomiting.  Genitourinary: Negative for dysuria and flank pain.  Musculoskeletal: Positive for back pain. Negative for neck pain and neck stiffness.  Skin: Negative for rash.  Neurological: Negative for light-headedness and headaches.     Physical Exam Updated Vital Signs BP (!) 178/119 (BP Location: Other (Comment)) Comment: Rt upper thigh   Pulse 69   Temp 97.7 F (36.5 C) (Oral)   Resp 18   Ht  (1.854 m)   Wt 241 lb (109.3 kg)   SpO2 99%   BMI 31.80 kg/m   Physical Exam  Constitutional: He is oriented to person, place, and time. He appears well-developed and well-nourished.  HENT:  Head: Normocephalic and atraumatic.  Eyes: Conjunctivae are normal. Right eye exhibits no discharge. Left eye exhibits no discharge.  Neck: Normal range of motion. Neck supple. No tracheal deviation present.  Cardiovascular: Normal rate and regular rhythm.   Pulmonary/Chest: Effort normal and breath sounds normal.  Abdominal: Soft. He exhibits no distension. There is no tenderness. There is no guarding.  Musculoskeletal: He exhibits tenderness. He exhibits no edema.  Mild back tenderness paraspinal lower lumbar.  Neurological: He is alert and oriented to person, place, and time. He has normal strength. No sensory deficit. GCS eye subscore is 4. GCS verbal subscore is 5. GCS motor subscore is 6.  Skin: Skin is warm. No rash noted.  Psychiatric: He has a normal mood and affect.  Nursing note and vitals reviewed.    ED Treatments / Results  Labs (all labs ordered are listed, but only abnormal results are displayed) Labs Reviewed  BASIC METABOLIC PANEL - Abnormal; Notable for the following:       Result Value    Potassium 6.2 (*)    BUN 76 (*)    Creatinine, Ser 15.89 (*)    Calcium 8.8 (*)    GFR calc non Af Amer 3 (*)    GFR calc Af Amer 3 (*)    All other components within normal limits    EKG  EKG Interpretation  Date/Time:  Tuesday Oct 17 2016 12:03:27 EDT Ventricular Rate:  68 PR Interval:  204 QRS Duration: 92 QT Interval:  422 QTC Calculation: 448 R Axis:   26 Text Interpretation:  Sinus rhythm with Premature atrial complexes in a pattern of bigeminy Otherwise normal ECG Confirmed by Jaretssi Kraker MD, Earl Losee ([id number]) on 10/17/2016 12:07:41 PM  Radiology No results found.  Procedures Procedures (including critical care time)  Medications Ordered in ED Medications  oxyCODONE-acetaminophen (PERCOCET/ROXICET) 5-325 MG per tablet 2 tablet (2 tablets Oral Given 10/17/16 1102)     Initial Impression / Assessment and Plan / ED Course  I have reviewed the triage vital signs and the nursing notes.  Pertinent labs & imaging results that were available during my care of the patient were reviewed by me and considered in my medical decision making (see chart for details).    Patient presents with chronic back pain out of his pain meds. Nothing new or new red flags. Discussed unable to fill his chronic pain medicines. Plan for his oral pain meds dose. Patient's blood pressure elevated in the ER, patient has chronic difficulty controlling blood pressure. Patient is on multiple medications. Patient only to follow closely with his primary doctors. Blood pressure 160 systolic in the room. Without symptoms of endorgan damage. Plan to check BMP.  Patient requesting to be discharged. Patient's potassium 6.2 and he has dialysis plan for small morning. Patient says he has no symptoms. Patient says he has to leave to pick up his son at school. Patient understands importance of getting dialysis as it is possible to lower the potassium. Patient has capacity make decisions. EKG no concerning  changes.  Results and differential diagnosis were discussed with the patient/parent/guardian. Xrays were independently reviewed by myself.  Close follow up outpatient was discussed, comfortable with the plan.   Medications  oxyCODONE-acetaminophen (PERCOCET/ROXICET) 5-325 MG per tablet 2 tablet (2 tablets Oral Given 10/17/16 1102)    Vitals:   10/17/16 0952 10/17/16 1007 10/17/16 1008 10/17/16 1204  BP: (!) 209/137 (!) 166/102  (!) 178/119  Pulse: 74  76 69  Resp: 18   18  Temp: 97.7 F (36.5 C)     TempSrc: Oral     SpO2: 100%  (!) 9% 99%  Weight: 241 lb (109.3 kg)     Height:  (1.854 m)       Final diagnoses:  Essential hypertension  Chronic back pain, unspecified back location, unspecified back pain laterality  Hyperkalemia      Final Clinical Impressions(s) / ED Diagnoses   Final diagnoses:  Essential hypertension  Chronic back pain, unspecified back location, unspecified back pain laterality  Hyperkalemia    New Prescriptions New Prescriptions   No medications on file     Blane Ohara, MD 10/17/16 1209    Blane Ohara, MD 10/17/16 1212

## 2016-10-17 NOTE — ED Notes (Signed)
Pt ride is now at bedside.

## 2016-10-17 NOTE — Discharge Instructions (Addendum)
Call dialysis center for dialysis as soon as you can for potassium of 6.2.    Discuss outpatient appointment management.for pain medicine If you were given medicines take as directed.  If you are on coumadin or contraceptives realize their levels and effectiveness is altered by many different medicines.  If you have any reaction (rash, tongues swelling, other) to the medicines stop taking and see a physician.    If your blood pressure was elevated in the ER make sure you follow up for management with a primary doctor or return for chest pain, shortness of breath or stroke symptoms.  Please follow up as directed and return to the ER or see a physician for new or worsening symptoms.  Thank you. Vitals:   10/17/16 0952  BP: (!) 209/137  Pulse: 74  Resp: 18  Temp: 97.7 F (36.5 C)  TempSrc: Oral  SpO2: 100%  Weight: 241 lb (109.3 kg)  Height:  (1.854 m)

## 2018-04-17 ENCOUNTER — Other Ambulatory Visit: Payer: Self-pay | Admitting: Urology

## 2018-04-26 NOTE — Progress Notes (Addendum)
Stress test 03-08-18 epic -care everywhere  ECHO 03-07-18 epic -care everywhere

## 2018-04-26 NOTE — Patient Instructions (Addendum)
Nicholas Green  04/26/2018   Your procedure is scheduled on: 05-07-18   Report to [facility] Main  Entrance    Report to admitting at 5:30AM    Call this number if you have problems the morning of surgery [phone]     Remember: Do not eat food or drink liquids :After Midnight. BRUSH YOUR TEETH MORNING OF SURGERY AND RINSE YOUR MOUTH OUT, NO CHEWING GUM CANDY OR MINTS.     Take these medicines the morning of surgery with A SIP OF WATER: hydralazine, eye drop                                You may not have any metal on your body including hair pins and              piercings  Do not wear jewelry, make-up, lotions, powders or perfumes, deodorant                      Men may shave face and neck.   Do not bring valuables to the hospital. [facility] IS NOT             RESPONSIBLE   FOR VALUABLES.  Contacts, dentures or bridgework may not be worn into surgery.       Patients discharged the day of surgery will not be allowed to drive home.  Name and phone number of your driver:  Special Instructions: N/A              Please read over the following fact sheets you were given: _____________________________________________________________________             [facility] - Preparing for Surgery Before surgery, you can play an important role.  Because skin is not sterile, your skin needs to be as free of germs as possible.  You can reduce the number of germs on your skin by washing with CHG (chlorahexidine gluconate) soap before surgery.  CHG is an antiseptic cleaner which kills germs and bonds with the skin to continue killing germs even after washing. Please DO NOT use if you have an allergy to CHG or antibacterial soaps.  If your skin becomes reddened/irritated stop using the CHG and inform your nurse when you arrive at Short Stay. Do not shave (including legs and underarms) for at least 48 hours prior to the first CHG shower.  You may shave your  face/neck. Please follow these instructions carefully:  1.  Shower with CHG Soap the night before surgery and the  morning of Surgery.  2.  If you choose to wash your hair, wash your hair first as usual with your  normal  shampoo.  3.  After you shampoo, rinse your hair and body thoroughly to remove the  shampoo.                           4.  Use CHG as you would any other liquid soap.  You can apply chg directly  to the skin and wash                       Gently with a scrungie or clean washcloth.  5.  Apply the CHG Soap to your body ONLY FROM THE NECK DOWN.  Do not use on face/ open                           Wound or open sores. Avoid contact with eyes, ears mouth and genitals (private parts).                       Wash face,  Genitals (private parts) with your normal soap.             6.  Wash thoroughly, paying special attention to the area where your surgery  will be performed.  7.  Thoroughly rinse your body with warm water from the neck down.  8.  DO NOT shower/wash with your normal soap after using and rinsing off  the CHG Soap.                9.  Pat yourself dry with a clean towel.            10.  Wear clean pajamas.            11.  Place clean sheets on your bed the night of your first shower and do not  sleep with pets. Day of Surgery : Do not apply any lotions/deodorants the morning of surgery.  Please wear clean clothes to the hospital/surgery center.  FAILURE TO FOLLOW THESE INSTRUCTIONS MAY RESULT IN THE CANCELLATION OF YOUR SURGERY PATIENT SIGNATURE_________________________________  NURSE SIGNATURE__________________________________  ________________________________________________________________________

## 2018-04-29 ENCOUNTER — Encounter (HOSPITAL_COMMUNITY): Payer: Self-pay

## 2018-04-29 ENCOUNTER — Encounter (HOSPITAL_COMMUNITY)
Admission: RE | Admit: 2018-04-29 | Discharge: 2018-04-29 | Disposition: A | Discharge status: Home / Self Care | Payer: Medicare Other | Source: Ambulatory Visit | Attending: Urology | Admitting: Urology

## 2018-04-29 ENCOUNTER — Other Ambulatory Visit: Payer: Self-pay

## 2018-04-29 DIAGNOSIS — Z01818 Encounter for other preprocedural examination: Secondary | ICD-10-CM | POA: Insufficient documentation

## 2018-04-29 DIAGNOSIS — I10 Essential (primary) hypertension: Secondary | ICD-10-CM | POA: Insufficient documentation

## 2018-04-29 HISTORY — DX: Anogenital (venereal) warts: A63.0

## 2018-04-29 HISTORY — DX: Supraventricular tachycardia, unspecified: I47.10

## 2018-04-29 HISTORY — DX: Supraventricular tachycardia: I47.1

## 2018-04-29 NOTE — Progress Notes (Signed)
EKG 03-06-18 ON CHART FROM [facility]

## 2018-05-06 NOTE — H&P (Signed)
CC/HPI: I have a penile lesion.     Nicholas Green returns today in f/u for evaluation of a penile lesion found on exam on 04/09/18. 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 He has no new complaints.   Patient history of end-stage renal disease utilizing peritoneal dialysis previously seen here in clinic for erectile dysfunction with the patient utilizing trimix injections.   Presents today complaining of a palpable growth of the glans penis first noticed approximately 1 week ago. Patient states it is nontender. Denies bleeding or discharge from the area. He has not had sexual contact since first noticing the area in question. He states not voiding on his own relying solely on peritoneal dialysis. Denies any urethral burning or discharge. Denies painful or bloody ejaculation. Denies recent fevers or infection treatment. Denies unexplained weight loss, unexplained bone pain, or unexplained fatigue. Denies past history of STi or known exposure to such.    ALLERGIES: Contrast Dye    MEDICATIONS: Ambien 10 mg tablet Oral  Amlodipine Besylate 10 mg tablet Oral  Benadryl TABS Oral  Calcium Acetate 667 mg capsule Oral  Carvedilol 25 mg tablet Oral  Clonidine Hcl 0.1 mg tablet Oral  Gabapentin 300 mg tablet Oral  Levemir 100 unit/ml vial Subcutaneous  Lipitor 80 mg tablet Oral  Losartan Potassium 100 mg tablet Oral  Metoprolol Succinate 50 mg tablet, extended release 24 hr Oral  Omeprazole 40 mg capsule,delayed release Oral  Oxycodone-Acetaminophen 10 mg-325 mg tablet Oral  Sensipar 30 mg tablet Oral     GU PSH: Rpr Umbil Hern; Reduc < 5 Yr - 2016      PSH Notes: Arm Incision, Inguinal Hernia Repair, Umbilical Hernia Repair, Renal Transplant   NON-GU PSH: Transplant Kidney - 2016    GU PMH: Disorder of male genital organs, unspecified - 04/09/2018 Primary hypogonadism, While he does have a low serum testosterone I have not recommended testosterone replacement therapy as it is not  indicated. 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 He will let me know if this is not working well for him. - 2018, Erectile dysfunction due to arterial insufficiency, - 2017 Personal Hx Oth Urinary System diseases, History of renal failure - 2016      PMH Notes: Hypogonadism: He was found to have a low serum testosterone and was started on topical testosterone replacement in 4/16.    ED: He had been having slowly progressive erectile dysfunction since ~6/15. He said his current erections are 2-3 on a scale of 10. He has tried Viagra and Cialis in the past without improvement and also used MUSE 1000 g which was also ineffective for him.   Risk factors for erectile dysfunction include diabetes, congestive heart failure, hyperlipidemia, hypertension and renal insufficiency.   Treatment: Intracavernosal injections PGE1 40 g      NON-GU PMH: Personal history of other diseases of the circulatory system, History of heart failure - 2016, History of hypertension, - 2016 Personal history of other diseases of the musculoskeletal system and connective tissue, History of gout - 2016, History of arthritis, - 2016 Personal history of other diseases of the nervous system and sense organs, History of glaucoma - 2016, History of sleep apnea, - 2016 Personal history of other endocrine, nutritional and metabolic disease, History of hyperlipidemia - 2016, History of diabetes mellitus, -  2016 Encounter for general adult medical examination without abnormal findings, Encounter for preventive health examination    FAMILY HISTORY: heart failure - Runs In Family Hypertension - Runs In Family renal failure - Runs In Family   SOCIAL HISTORY: No Social History     Notes: Caffeine use, Disabled, Former smoker, Number  of children, Alcohol use, Married   REVIEW OF SYSTEMS:    GU Review Male:   Patient denies frequent urination, hard to postpone urination, burning/ pain with urination, get up at night to urinate, leakage of urine, stream starts and stops, trouble starting your stream, have to strain to urinate , erection problems, and penile pain.  Gastrointestinal (Upper):   Patient denies nausea, vomiting, and indigestion/ heartburn.  Gastrointestinal (Lower):   Patient denies diarrhea and constipation.  Constitutional:   Patient denies fever, night sweats, weight loss, and fatigue.  Skin:   Patient denies skin rash/ lesion and itching.  Eyes:   Patient denies blurred vision and double vision.  Ears/ Nose/ Throat:   Patient denies sinus problems and sore throat.  Hematologic/Lymphatic:   Patient denies swollen glands and easy bruising.  Cardiovascular:   Patient denies leg swelling and chest pains.  Respiratory:   Patient denies cough and shortness of breath.  Endocrine:   Patient denies excessive thirst.  Musculoskeletal:   Patient denies back pain and joint pain.  Neurological:   Patient denies headaches and dizziness.  Psychologic:   Patient denies depression and anxiety.   VITAL SIGNS:      04/15/2018 02:22 PM  Temperature 98.4 F / 36.8 C  Notes: unable to get pt blood pressure   GU PHYSICAL EXAMINATION:    Penis: Penis uncircumcised, 1 penile wart about 5mm at the left meatal edge.    MULTI-SYSTEM PHYSICAL EXAMINATION:    Constitutional: Well-nourished. No physical deformities. Normally developed. Good grooming.  Respiratory: No labored breathing, no use of accessory muscles. CTA  Cardiovascular: Normal temperature, RRR without murmur.      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 I am going to get him set up for outpatient biopsy and urethroscopy. The risks of bleeding, infection, injury to the penis, need for secondary treatments and anesthetic complications reviewed.    PLAN:           Schedule Return Visit/Planned Activity: ASAP - Schedule Surgery          Document Letter(s):  Created for Patient: Clinical Summary

## 2018-05-07 ENCOUNTER — Ambulatory Visit (HOSPITAL_COMMUNITY): Payer: Medicare Other | Admitting: Anesthesiology

## 2018-05-07 ENCOUNTER — Encounter (HOSPITAL_COMMUNITY)
Admission: RE | Disposition: A | Discharge status: Home / Self Care | Payer: Self-pay | Source: Other Acute Inpatient Hospital | Attending: Urology

## 2018-05-07 ENCOUNTER — Ambulatory Visit (HOSPITAL_COMMUNITY)
Admission: RE | Admit: 2018-05-07 | Discharge: 2018-05-07 | Disposition: A | Discharge status: Home / Self Care | Payer: Medicare Other | Source: Other Acute Inpatient Hospital | Attending: Urology | Admitting: Urology

## 2018-05-07 ENCOUNTER — Encounter (HOSPITAL_COMMUNITY): Payer: Self-pay

## 2018-05-07 DIAGNOSIS — A630 Anogenital (venereal) warts: Secondary | ICD-10-CM | POA: Insufficient documentation

## 2018-05-07 DIAGNOSIS — Z79891 Long term (current) use of opiate analgesic: Secondary | ICD-10-CM | POA: Diagnosis not present

## 2018-05-07 DIAGNOSIS — G4730 Sleep apnea, unspecified: Secondary | ICD-10-CM | POA: Diagnosis not present

## 2018-05-07 DIAGNOSIS — I509 Heart failure, unspecified: Secondary | ICD-10-CM | POA: Insufficient documentation

## 2018-05-07 DIAGNOSIS — Z841 Family history of disorders of kidney and ureter: Secondary | ICD-10-CM | POA: Diagnosis not present

## 2018-05-07 DIAGNOSIS — Z794 Long term (current) use of insulin: Secondary | ICD-10-CM | POA: Insufficient documentation

## 2018-05-07 DIAGNOSIS — Z8249 Family history of ischemic heart disease and other diseases of the circulatory system: Secondary | ICD-10-CM | POA: Insufficient documentation

## 2018-05-07 DIAGNOSIS — E1122 Type 2 diabetes mellitus with diabetic chronic kidney disease: Secondary | ICD-10-CM | POA: Diagnosis not present

## 2018-05-07 DIAGNOSIS — Z940 Kidney transplant status: Secondary | ICD-10-CM | POA: Insufficient documentation

## 2018-05-07 DIAGNOSIS — Z79899 Other long term (current) drug therapy: Secondary | ICD-10-CM | POA: Insufficient documentation

## 2018-05-07 DIAGNOSIS — N186 End stage renal disease: Secondary | ICD-10-CM | POA: Insufficient documentation

## 2018-05-07 DIAGNOSIS — Z992 Dependence on renal dialysis: Secondary | ICD-10-CM | POA: Diagnosis not present

## 2018-05-07 DIAGNOSIS — Z87891 Personal history of nicotine dependence: Secondary | ICD-10-CM | POA: Insufficient documentation

## 2018-05-07 DIAGNOSIS — E785 Hyperlipidemia, unspecified: Secondary | ICD-10-CM | POA: Diagnosis not present

## 2018-05-07 DIAGNOSIS — I132 Hypertensive heart and chronic kidney disease with heart failure and with stage 5 chronic kidney disease, or end stage renal disease: Secondary | ICD-10-CM | POA: Diagnosis not present

## 2018-05-07 DIAGNOSIS — N5089 Other specified disorders of the male genital organs: Secondary | ICD-10-CM | POA: Diagnosis present

## 2018-05-07 HISTORY — PX: CYSTOSCOPY WITH BIOPSY: SHX5122

## 2018-05-07 LAB — CBC
HEMATOCRIT: 41.2 % (ref 39.0–52.0)
HEMOGLOBIN: 13.3 g/dL (ref 13.0–17.0)
MCH: 35.7 pg — ABNORMAL HIGH (ref 26.0–34.0)
MCHC: 32.3 g/dL (ref 30.0–36.0)
MCV: 110.5 fL — ABNORMAL HIGH (ref 80.0–100.0)
NRBC: 0 % (ref 0.0–0.2)
Platelets: 127 10*3/uL — ABNORMAL LOW (ref 150–400)
RBC: 3.73 MIL/uL — AB (ref 4.22–5.81)
RDW: 18.9 % — AB (ref 11.5–15.5)
WBC: 6.4 10*3/uL (ref 4.0–10.5)

## 2018-05-07 LAB — BASIC METABOLIC PANEL
ANION GAP: 18 — AB (ref 5–15)
BUN: 56 mg/dL — ABNORMAL HIGH (ref 6–20)
CO2: 21 mmol/L — ABNORMAL LOW (ref 22–32)
CREATININE: 17.2 mg/dL — AB (ref 0.61–1.24)
Calcium: 8.3 mg/dL — ABNORMAL LOW (ref 8.9–10.3)
Chloride: 101 mmol/L (ref 98–111)
GFR calc non Af Amer: 3 mL/min — ABNORMAL LOW (ref >60)
GFR, EST AFRICAN AMERICAN: 3 mL/min — AB (ref >60)
Glucose, Bld: 98 mg/dL (ref 70–99)
POTASSIUM: 3.4 mmol/L — AB (ref 3.5–5.1)
Sodium: 140 mmol/L (ref 135–145)

## 2018-05-07 SURGERY — CYSTOSCOPY, WITH BIOPSY
Anesthesia: General

## 2018-05-07 MED ORDER — ONDANSETRON HCL 4 MG/2ML IJ SOLN
INTRAMUSCULAR | Status: AC
  Filled 2018-05-07: qty 2

## 2018-05-07 MED ORDER — PHENYLEPHRINE 40 MCG/ML (10ML) SYRINGE FOR IV PUSH (FOR BLOOD PRESSURE SUPPORT)
PREFILLED_SYRINGE | INTRAVENOUS | Status: AC
  Filled 2018-05-07: qty 10

## 2018-05-07 MED ORDER — LACTATED RINGERS IV SOLN: INTRAVENOUS | Status: DC

## 2018-05-07 MED ORDER — FENTANYL CITRATE (PF) 100 MCG/2ML IJ SOLN
INTRAMUSCULAR | Status: DC | PRN
  Administered 2018-05-07: 25 ug via INTRAVENOUS
  Administered 2018-05-07: 50 ug via INTRAVENOUS
  Administered 2018-05-07: 25 ug via INTRAVENOUS

## 2018-05-07 MED ORDER — FENTANYL CITRATE (PF) 100 MCG/2ML IJ SOLN
INTRAMUSCULAR | Status: AC
  Filled 2018-05-07: qty 2

## 2018-05-07 MED ORDER — MIDAZOLAM HCL 5 MG/5ML IJ SOLN
INTRAMUSCULAR | Status: DC | PRN
  Administered 2018-05-07: 1 mg via INTRAVENOUS

## 2018-05-07 MED ORDER — 0.9 % SODIUM CHLORIDE (POUR BTL) OPTIME
TOPICAL | Status: DC | PRN
  Administered 2018-05-07: 1000 mL

## 2018-05-07 MED ORDER — PROPOFOL 10 MG/ML IV BOLUS
INTRAVENOUS | Status: DC | PRN
  Administered 2018-05-07: 200 mg via INTRAVENOUS

## 2018-05-07 MED ORDER — MIDAZOLAM HCL 2 MG/2ML IJ SOLN
INTRAMUSCULAR | Status: AC
  Filled 2018-05-07: qty 2

## 2018-05-07 MED ORDER — SODIUM CHLORIDE 0.9 % IV SOLN
Freq: Once | INTRAVENOUS | Status: AC
  Administered 2018-05-07: 07:00:00 via INTRAVENOUS

## 2018-05-07 MED ORDER — LIDOCAINE HCL (CARDIAC) PF 100 MG/5ML IV SOSY
PREFILLED_SYRINGE | INTRAVENOUS | Status: DC | PRN
  Administered 2018-05-07: 100 mg via INTRAVENOUS

## 2018-05-07 MED ORDER — FENTANYL CITRATE (PF) 100 MCG/2ML IJ SOLN
25.0000 ug | INTRAMUSCULAR | Status: DC | PRN
  Administered 2018-05-07: 50 ug via INTRAVENOUS
  Administered 2018-05-07 (×2): 25 ug via INTRAVENOUS

## 2018-05-07 MED ORDER — ONDANSETRON HCL 4 MG/2ML IJ SOLN
INTRAMUSCULAR | Status: DC | PRN
  Administered 2018-05-07: 4 mg via INTRAVENOUS

## 2018-05-07 MED ORDER — LIDOCAINE 2% (20 MG/ML) 5 ML SYRINGE
INTRAMUSCULAR | Status: AC
  Filled 2018-05-07: qty 5

## 2018-05-07 MED ORDER — PHENYLEPHRINE HCL 10 MG/ML IJ SOLN
INTRAMUSCULAR | Status: DC | PRN
  Administered 2018-05-07 (×3): 40 ug via INTRAVENOUS
  Administered 2018-05-07: 80 ug via INTRAVENOUS
  Administered 2018-05-07: 40 ug via INTRAVENOUS

## 2018-05-07 MED ORDER — LACTATED RINGERS IV SOLN
INTRAVENOUS | Status: DC
  Administered 2018-05-07: 07:00:00 via INTRAVENOUS

## 2018-05-07 MED ORDER — PROPOFOL 10 MG/ML IV BOLUS
INTRAVENOUS | Status: AC
  Filled 2018-05-07: qty 20

## 2018-05-07 MED ORDER — STERILE WATER FOR IRRIGATION IR SOLN
Status: DC | PRN
  Administered 2018-05-07: 3000 mL

## 2018-05-07 MED ORDER — SODIUM CHLORIDE 0.9 % IV SOLN
INTRAVENOUS | Status: DC | PRN
  Administered 2018-05-07: 07:00:00 via INTRAVENOUS

## 2018-05-07 MED ORDER — CEFAZOLIN SODIUM-DEXTROSE 2-4 GM/100ML-% IV SOLN
2.0000 g | INTRAVENOUS | Status: AC
  Administered 2018-05-07: 2 g via INTRAVENOUS
  Filled 2018-05-07: qty 100

## 2018-05-07 MED ORDER — MEPERIDINE HCL 50 MG/ML IJ SOLN: 6.2500 mg | INTRAMUSCULAR | Status: DC | PRN

## 2018-05-07 MED ORDER — PROMETHAZINE HCL 25 MG/ML IJ SOLN: 6.2500 mg | INTRAMUSCULAR | Status: DC | PRN

## 2018-05-07 SURGICAL SUPPLY — 26 items
BAG URINE DRAINAGE (UROLOGICAL SUPPLIES) IMPLANT
BAG URO CATCHER STRL LF (MISCELLANEOUS) ×3 IMPLANT
BLADE 10 SAFETY STRL DISP (BLADE) ×3 IMPLANT
CATH FOLEY 3WAY 30CC 22FR (CATHETERS) IMPLANT
CATH URET 5FR 28IN OPEN ENDED (CATHETERS) IMPLANT
COVER SURGICAL LIGHT HANDLE (MISCELLANEOUS) ×3 IMPLANT
COVER WAND RF STERILE (DRAPES) IMPLANT
FIBER LASER FLEXIVA 1000 (UROLOGICAL SUPPLIES) IMPLANT
FIBER LASER FLEXIVA 365 (UROLOGICAL SUPPLIES) IMPLANT
FIBER LASER FLEXIVA 550 (UROLOGICAL SUPPLIES) IMPLANT
FIBER LASER TRAC TIP (UROLOGICAL SUPPLIES) IMPLANT
GLOVE SURG SS PI 8.0 STRL IVOR (GLOVE) IMPLANT
GOWN STRL REUS W/TWL XL LVL3 (GOWN DISPOSABLE) ×3 IMPLANT
HOLDER FOLEY CATH W/STRAP (MISCELLANEOUS) IMPLANT
LEGGING LITHOTOMY PAIR STRL (DRAPES) ×3 IMPLANT
LOOP CUT BIPOLAR 24F LRG (ELECTROSURGICAL) IMPLANT
MANIFOLD NEPTUNE II (INSTRUMENTS) ×3 IMPLANT
PACK CYSTO (CUSTOM PROCEDURE TRAY) ×3 IMPLANT
SET ASPIRATION TUBING (TUBING) IMPLANT
SUT ETHILON 3 0 PS 1 (SUTURE) IMPLANT
SUT MNCRL AB 4-0 PS2 18 (SUTURE) ×3 IMPLANT
SYR 30ML LL (SYRINGE) IMPLANT
SYRINGE IRR TOOMEY STRL 70CC (SYRINGE) IMPLANT
TUBING CONNECTING 10 (TUBING) ×2 IMPLANT
TUBING CONNECTING 10' (TUBING) ×1
TUBING UROLOGY SET (TUBING) IMPLANT

## 2018-05-07 NOTE — Discharge Instructions (Signed)
1. You may see some blood in the urine and may have some burning with urination for 48-72 hours. You also may notice that you have to urinate more frequently or urgently after your procedure which is normal.  2. You should call should you develop an inability urinate, fever > 101, persistent nausea and vomiting that prevents you from eating or drinking to stay hydrated.        3.   You have a suture at the site where the condyloma was removed. This will absorb on its own and does not need to be removed.        4.   For pain control, you may take tylenol as needed.

## 2018-05-07 NOTE — Anesthesia Procedure Notes (Signed)
Procedure Name: LMA Insertion Date/Time: 05/07/2018 7:40 AM Performed by: Ludwig LeanJones, Akshara Blumenthal C, CRNA Pre-anesthesia Checklist: Patient identified, Emergency Drugs available, Suction available and Patient being monitored Patient Re-evaluated:Patient Re-evaluated prior to induction Oxygen Delivery Method: Circle system utilized Preoxygenation: Pre-oxygenation with 100% oxygen Induction Type: IV induction Ventilation: Mask ventilation without difficulty LMA: LMA inserted LMA Size: 5.0 Number of attempts: 1 Placement Confirmation: positive ETCO2 and breath sounds checked- equal and bilateral Tube secured with: Tape Dental Injury: Teeth and Oropharynx as per pre-operative assessment

## 2018-05-07 NOTE — Anesthesia Postprocedure Evaluation (Signed)
Anesthesia Post Note  Patient: Sheran SpineRicky R Renbarger  Procedure(s) Performed: CYSTOSCOPY WITH EXCISIONAL BIOPSY OF PENILE CONDYLOMA (N/A )     Patient location during evaluation: PACU Anesthesia Type: General Level of consciousness: awake and alert Pain management: pain level controlled Vital Signs Assessment: post-procedure vital signs reviewed and stable Respiratory status: spontaneous breathing, nonlabored ventilation, respiratory function stable and patient connected to nasal cannula oxygen Cardiovascular status: blood pressure returned to baseline and stable Postop Assessment: no apparent nausea or vomiting Anesthetic complications: no    Last Vitals:  Vitals:   05/07/18 0900 05/07/18 0915  BP: 126/81 (!) 149/78  Pulse: 74 79  Resp: 14 (!) 23  Temp:  36.4 C  SpO2: 100% 95%    Last Pain:  Vitals:   05/07/18 0830  TempSrc:   PainSc: Asleep                 Shelton SilvasKevin D Kaymon Denomme

## 2018-05-07 NOTE — Interval H&P Note (Signed)
History and Physical Interval Note:  05/07/2018 7:03 AM  Nicholas Green  has presented today for surgery, with the diagnosis of MEATAL CONDYLOMA  The various methods of treatment have been discussed with the patient and family. After consideration of risks, benefits and other options for treatment, the patient has consented to  Procedure(s): CYSTOSCOPY WITH BIOPSY OF CONDYLOMA (N/A) POSSIBLE HOLMIUM LASER APPLICATION (N/A) as a surgical intervention .  The patient's history has been reviewed, patient examined, no change in status, stable for surgery.  I have reviewed the patient's chart and labs.  Questions were answered to the patient's satisfaction.     Bjorn PippinJohn Cleophas Yoak

## 2018-05-07 NOTE — Op Note (Signed)
Preoperative Diagnosis: Left meatal condyloma  Postoperative Diagnosis: Left meatal condyloma  Procedure(s) Performed:  CYSTOSCOPY  EXCISIONAL BIOPSY OF PENILE CONDYLOMA  Surgeon:  Surgeon(s): Bjorn PippinWrenn, John, MD  Resident Surgeon: Elyn AquasKathryn H Elvert Cumpton, MD  Assistant(s): None  Anesthesia: General  Fluids: See anesthesia record  Estimated blood loss:  0 mL  Specimens:   ID Type Source Tests Collected by Time Destination  1 : Penile Condyloma Tissue Soft Tissue, Other SURGICAL PATHOLOGY Bjorn PippinWrenn, John, MD 05/07/2018 [phone]     Drains: None  Complications: None  Indications:  This is a 52 y.o. patient with a history of ESRD on dialysis who presented with a 5 mm left meatal condyloma. After discussion of the risks & benefits and alternatives to surgical approach, the patient wishes to proceed with excisional biopsy of penile condyloma. Given location on the meatus, cystoscopy to evaluate for additional urethral lesions was recommended and the patient agreed. Risks & benefits of the procedure discussed with the patient, who wishes to proceed.   Findings:  1. Cystoscopy demonstrated mild white mucosal areas along the left side of the bladder consistent with bladder stretching in setting of minimal urine output on peritoneal dialysis.  2. Urethroscopy demonstrated no lesions present with the urethra. 3. 5 mm condyloma present on the left meatus was sharply excised and closed with a figure of 8 monocryl suture.  Procedure:  The patient was identified in the pre-operative holding area. After informed consent the patient was taken to the operating room and placed on the table in a supine position. General anesthesia was then administered. Once fully anesthetized the patient was moved to the dorsal lithotomy position and the genitalia were sterilely prepped and draped in standard fashion. An official timeout was then performed.  Cystoscopy was performed with a 22 Fr rigid cystoscope with irrigation  running and findings as described above.  Attention was then turned to the left meatal condyloma. Just below the condyloma, the skin was crushed with hemostats and the condyloma was excised along the line of crushed tissue. The site was closed with a figure-of-8 4-0 monocryl suture and hemostasis was confirmed. Condyloma was sent for pathology.  Patient tolerated the procedure well without complications. He was awoken from anesthesia and transferred to the recovery room.   Plan: 1. Discharge home from PACU when meeting discharge criteria and voids x 1. 2. Follow-up as previously scheduled for post-operative visit and pathology review.

## 2018-05-07 NOTE — Transfer of Care (Signed)
Immediate Anesthesia Transfer of Care Note  Patient: Nicholas Green  Procedure(s) Performed: Procedure(s): CYSTOSCOPY WITH EXCISIONAL BIOPSY OF PENILE CONDYLOMA (N/A)  Patient Location: PACU  Anesthesia Type:General  Level of Consciousness: Patient easily awoken, sedated, comfortable, cooperative, following commands, responds to stimulation.   Airway & Oxygen Therapy: Patient spontaneously breathing, ventilating well, oxygen via simple oxygen mask.  Post-op Assessment: Report given to PACU RN, vital signs reviewed and stable, moving all extremities.   Post vital signs: Reviewed and stable.  Complications: No apparent anesthesia complications  Last Vitals:  Vitals Value Taken Time  BP 146/90 05/07/2018  8:13 AM  Temp    Pulse 87 05/07/2018  8:14 AM  Resp 17 05/07/2018  8:14 AM  SpO2 100 % 05/07/2018  8:14 AM  Vitals shown include unvalidated device data.  Last Pain:  Vitals:   05/07/18 0643  TempSrc:   PainSc: 8       Patients Stated Pain Goal: 6 (05/07/18 [id number])  Complications: No apparent anesthesia complications

## 2018-05-07 NOTE — Anesthesia Preprocedure Evaluation (Addendum)
Anesthesia Evaluation  Patient identified by MRN, date of birth, ID band Patient awake    Reviewed: Allergy & Precautions, NPO status , Patient's Chart, lab work & pertinent test results  Airway Mallampati: III  TM Distance: >3 FB Neck ROM: Full    Dental  (+) Teeth Intact, Dental Advisory Given   Pulmonary former smoker,    breath sounds clear to auscultation       Cardiovascular hypertension, Pt. on medications  Rhythm:Regular Rate:Normal     Neuro/Psych negative neurological ROS     GI/Hepatic negative GI ROS, Neg liver ROS,   Endo/Other    Renal/GU DialysisRenal disease     Musculoskeletal negative musculoskeletal ROS (+)   Abdominal Normal abdominal exam  (+)   Peds  Hematology negative hematology ROS (+)   Anesthesia Other Findings   Reproductive/Obstetrics                            Anesthesia Physical Anesthesia Plan  ASA: III  Anesthesia Plan: General   Post-op Pain Management:    Induction: Intravenous  PONV Risk Score and Plan: 3 and Ondansetron, Dexamethasone and Midazolam  Airway Management Planned: LMA  Additional Equipment: None  Intra-op Plan:   Post-operative Plan: Extubation in OR  Informed Consent: I have reviewed the patients History and Physical, chart, labs and discussed the procedure including the risks, benefits and alternatives for the proposed anesthesia with the patient or authorized representative who has indicated his/her understanding and acceptance.   Dental advisory given  Plan Discussed with: CRNA  Anesthesia Plan Comments:        Anesthesia Quick Evaluation

## 2018-05-08 NOTE — Addendum Note (Signed)
Addendum  created 05/08/18 0618 by Elyn PeersAllen, Ralph Brouwer J, CRNA   Charge Capture section accepted

## 2018-05-10 ENCOUNTER — Encounter (HOSPITAL_COMMUNITY): Payer: Self-pay | Admitting: Urology

## 2020-05-25 ENCOUNTER — Other Ambulatory Visit (HOSPITAL_COMMUNITY): Payer: Self-pay | Admitting: Internal Medicine

## 2020-05-25 ENCOUNTER — Other Ambulatory Visit: Payer: Self-pay

## 2020-05-25 ENCOUNTER — Ambulatory Visit (HOSPITAL_COMMUNITY)
Admission: RE | Admit: 2020-05-25 | Discharge: 2020-05-25 | Disposition: A | Discharge status: Home / Self Care | Payer: 59 | Source: Ambulatory Visit | Attending: Vascular Surgery | Admitting: Vascular Surgery

## 2020-05-25 DIAGNOSIS — M7989 Other specified soft tissue disorders: Secondary | ICD-10-CM

## 2020-06-07 ENCOUNTER — Emergency Department (HOSPITAL_BASED_OUTPATIENT_CLINIC_OR_DEPARTMENT_OTHER)
Admission: EM | Admit: 2020-06-07 | Discharge: 2020-06-08 | Disposition: A | Discharge status: Home / Self Care | Payer: 59 | Attending: Emergency Medicine | Admitting: Emergency Medicine

## 2020-06-07 ENCOUNTER — Other Ambulatory Visit: Payer: Self-pay

## 2020-06-07 ENCOUNTER — Emergency Department (HOSPITAL_BASED_OUTPATIENT_CLINIC_OR_DEPARTMENT_OTHER): Payer: 59

## 2020-06-07 ENCOUNTER — Encounter (HOSPITAL_BASED_OUTPATIENT_CLINIC_OR_DEPARTMENT_OTHER): Payer: Self-pay

## 2020-06-07 DIAGNOSIS — I313 Pericardial effusion (noninflammatory): Secondary | ICD-10-CM | POA: Diagnosis not present

## 2020-06-07 DIAGNOSIS — M7989 Other specified soft tissue disorders: Secondary | ICD-10-CM | POA: Diagnosis not present

## 2020-06-07 DIAGNOSIS — I700 Atherosclerosis of aorta: Secondary | ICD-10-CM | POA: Diagnosis not present

## 2020-06-07 DIAGNOSIS — Z87891 Personal history of nicotine dependence: Secondary | ICD-10-CM | POA: Diagnosis not present

## 2020-06-07 DIAGNOSIS — I10 Essential (primary) hypertension: Secondary | ICD-10-CM | POA: Diagnosis not present

## 2020-06-07 DIAGNOSIS — N2889 Other specified disorders of kidney and ureter: Secondary | ICD-10-CM | POA: Diagnosis not present

## 2020-06-07 DIAGNOSIS — N151 Renal and perinephric abscess: Secondary | ICD-10-CM | POA: Diagnosis not present

## 2020-06-07 DIAGNOSIS — Z940 Kidney transplant status: Secondary | ICD-10-CM | POA: Diagnosis not present

## 2020-06-07 DIAGNOSIS — Z79899 Other long term (current) drug therapy: Secondary | ICD-10-CM | POA: Insufficient documentation

## 2020-06-07 DIAGNOSIS — R2231 Localized swelling, mass and lump, right upper limb: Secondary | ICD-10-CM | POA: Diagnosis present

## 2020-06-07 LAB — BASIC METABOLIC PANEL
Anion gap: 11 (ref 5–15)
BUN: 18 mg/dL (ref 6–20)
CO2: 24 mmol/L (ref 22–32)
Calcium: 9.1 mg/dL (ref 8.9–10.3)
Chloride: 105 mmol/L (ref 98–111)
Creatinine, Ser: 1.51 mg/dL — ABNORMAL HIGH (ref 0.61–1.24)
GFR, Estimated: 54 mL/min — ABNORMAL LOW (ref >60)
Glucose, Bld: 173 mg/dL — ABNORMAL HIGH (ref 70–99)
Potassium: 3.7 mmol/L (ref 3.5–5.1)
Sodium: 140 mmol/L (ref 135–145)

## 2020-06-07 LAB — CBC WITH DIFFERENTIAL/PLATELET
Abs Immature Granulocytes: 0.02 10*3/uL (ref 0.00–0.07)
Basophils Absolute: 0 10*3/uL (ref 0.0–0.1)
Basophils Relative: 1 %
Eosinophils Absolute: 0 10*3/uL (ref 0.0–0.5)
Eosinophils Relative: 1 %
HCT: 35.3 % — ABNORMAL LOW (ref 39.0–52.0)
Hemoglobin: 11.3 g/dL — ABNORMAL LOW (ref 13.0–17.0)
Immature Granulocytes: 1 %
Lymphocytes Relative: 18 %
Lymphs Abs: 0.8 10*3/uL (ref 0.7–4.0)
MCH: 30.1 pg (ref 26.0–34.0)
MCHC: 32 g/dL (ref 30.0–36.0)
MCV: 93.9 fL (ref 80.0–100.0)
Monocytes Absolute: 0.4 10*3/uL (ref 0.1–1.0)
Monocytes Relative: 9 %
Neutro Abs: 3 10*3/uL (ref 1.7–7.7)
Neutrophils Relative %: 70 %
Platelets: 277 10*3/uL (ref 150–400)
RBC: 3.76 MIL/uL — ABNORMAL LOW (ref 4.22–5.81)
RDW: 16.8 % — ABNORMAL HIGH (ref 11.5–15.5)
WBC: 4.2 10*3/uL (ref 4.0–10.5)
nRBC: 0 % (ref 0.0–0.2)

## 2020-06-07 LAB — CK: Total CK: 60 U/L (ref 49–397)

## 2020-06-07 MED ORDER — HYDROCORTISONE NA SUCCINATE PF 100 MG IJ SOLR
INTRAMUSCULAR | Status: AC
  Administered 2020-06-07: 200 mg
  Filled 2020-06-07: qty 4

## 2020-06-07 MED ORDER — DIPHENHYDRAMINE HCL 25 MG PO CAPS: 50.0000 mg | ORAL_CAPSULE | Freq: Once | ORAL | Status: AC

## 2020-06-07 MED ORDER — DIPHENHYDRAMINE HCL 50 MG/ML IJ SOLN
50.0000 mg | Freq: Once | INTRAMUSCULAR | Status: AC
  Administered 2020-06-08: 50 mg via INTRAVENOUS
  Filled 2020-06-07: qty 1

## 2020-06-07 MED ORDER — HYDROCORTISONE NA SUCCINATE PF 250 MG IJ SOLR
200.0000 mg | Freq: Once | INTRAMUSCULAR | Status: DC
  Filled 2020-06-07: qty 200

## 2020-06-07 MED ORDER — HYDROMORPHONE HCL 1 MG/ML IJ SOLN
1.0000 mg | Freq: Once | INTRAMUSCULAR | Status: AC
  Administered 2020-06-07: 1 mg via INTRAVENOUS
  Filled 2020-06-07: qty 1

## 2020-06-07 NOTE — ED Notes (Signed)
Patient transported to CT 

## 2020-06-07 NOTE — ED Triage Notes (Signed)
Pt arrives with right arm swelling from hand to shoulder. States had 2 ultrasounds which were negative. Swelling and pain to arm.

## 2020-06-07 NOTE — ED Notes (Signed)
ED Provider at bedside. 

## 2020-06-07 NOTE — ED Provider Notes (Signed)
[facility] EMERGENCY DEPARTMENT Provider Note   CSN: [id number] Arrival date & time: 06/07/20  1333     History Chief Complaint  Patient presents with   Arm Swelling    Nicholas Green is a 54 y.o. male with PMHx HTN, Gout, chronic renal failure s/p renal transplant who presents to the ED today with complaint of gradual onset, constant, worsening, right arm swelling x 3 weeks. Pt reports he noticed a small amount of swelling to the right arm that has gradually increased in size. He saw his PCP 1.5 weeks ago who ordered an outpatient ultrasound study which was negative for DVT. Pt reports he went to another ED 2 days ago and had a repeat ultrasound which was negative for DVT. He went to see his PCP today who advised he come to the ED for further evaluation. Pt complains of pain to the entire RUE as well. He states anytime he moves his arm he will having a pull sensation by his right shoulder blade. He denies any trauma/fall recently. No recent IV to the arm and no history of IVDA. Denies fevers or chills. No chest pain or SOB. No new medications. No other complaints at this time.   DVT 12/18 History: Right upper extremity edema  Comparison: Ultrasound of the right upper extremity dated 07/17/2018.  Technique: Ultrasound survey of the venous system of the right upper extremity was performed from the neck to the forearm. Spectral and color Doppler imaging performed.   Findings: Appropriate color Doppler flow and waveform activity were present. No venous thrombosis was demonstrated within the internal jugular, subclavian, axillary, brachial, basilic, cephalic, radial or ulnar veins.   The history is provided by the patient and medical records.       Past Medical History:  Diagnosis Date   Chronic renal failure    S/P renal transplant   Condyloma    meatal condyloma    Dialysis patient [facility](HCC)    reports he gets dialysis every evening ; has graft in left upper  extremity, shunt in right forearm and also has peritoneal access   Gout    Hypertension    Reflux    S/P kidney transplant    SVT (supraventricular tachycardia) (HCC)    SEE ED VISIT EPIC CARE EVERYWHERE 03-06-18 ; PATIENT HAD LOW PAOTASSIUM LEVELS, CARDIO CONSULT DONE AND UNDERWENT STRESS TEST AND ECHO . DENIES RECURENCE OF SX AT TODAY PRE-OP 04-29-18     There are no problems to display for this patient.   Past Surgical History:  Procedure Laterality Date   AV FISTULA PLACEMENT     left arm   CYSTOSCOPY WITH BIOPSY N/A 05/07/2018   Procedure: CYSTOSCOPY WITH EXCISIONAL BIOPSY OF PENILE CONDYLOMA;  Surgeon: Bjorn PippinWrenn, John, MD;  Location: [facility] ORS;  Service: Urology;  Laterality: N/A;   HERNIA REPAIR     KIDNEY TRANSPLANT Right 2008   NASAL FRACTURE SURGERY         History reviewed. No pertinent family history.  Social History   Tobacco Use   Smoking status: Former Smoker   Smokeless tobacco: Never Used  Substance Use Topics   Alcohol use: No   Drug use: No    Home Medications Prior to Admission medications   Medication Sig Start Date End Date Taking? Authorizing Provider  brimonidine-timolol (COMBIGAN) 0.2-0.5 % ophthalmic solution Place 1 drop into both eyes every 12 (twelve) hours.    [provider]  calcitRIOL (ROCALTROL) 0.25 MCG capsule Take 0.25 mcg by  mouth daily.    [provider]  calcium carbonate (TUMS - DOSED IN MG ELEMENTAL CALCIUM) 500 MG chewable tablet Chew 1-2 tablets by mouth 3 (three) times daily as needed for indigestion or heartburn.    [provider]  cinacalcet (SENSIPAR) 60 MG tablet Take 60 mg by mouth daily.    [provider]  cloNIDine (CATAPRES - DOSED IN MG/24 HR) 0.3 mg/24hr patch Place 0.3 mg onto the skin every Sunday.    [provider]  ferric citrate (AURYXIA) 1 GM 210 MG(Fe) tablet Take 420-840 mg by mouth See admin instructions. Take 2 tablets (420 mg) by mouth with each snack &  take 4 tablets (840 mg) by mouth with each meal    [provider]  hydrALAZINE (APRESOLINE) 100 MG tablet Take 100 mg by mouth 3 (three) times daily. 04/10/18   [provider]  nitroGLYCERIN (NITROSTAT) 0.4 MG SL tablet Place 0.4 mg under the tongue every 5 (five) minutes as needed for chest pain.     [provider]  potassium chloride SA (K-DUR,KLOR-CON) 20 MEQ tablet Take 20 mEq by mouth daily.    [provider]    Allergies    Ivp dye [iodinated diagnostic agents]  Review of Systems   Review of Systems  Constitutional: Negative for chills and fever.  Musculoskeletal: Positive for arthralgias and joint swelling.  All other systems reviewed and are negative.   Physical Exam Updated Vital Signs BP (!) 185/111 (BP Location: Other (Comment)) Comment (BP Location): Right Ankle, unable to get BP on PT arm due to PT request   Pulse 75    Temp 98.2 F (36.8 C) (Oral)    Resp 18    Ht 6\' 1"  (1.854 m)    Wt 95.3 kg    SpO2 99%    BMI 27.71 kg/m   Physical Exam Vitals and nursing note reviewed.  Constitutional:      Appearance: He is not ill-appearing.  HENT:     Head: Normocephalic and atraumatic.  Eyes:     Conjunctiva/sclera: Conjunctivae normal.  Cardiovascular:     Rate and Rhythm: Normal rate and regular rhythm.  Pulmonary:     Effort: Pulmonary effort is normal.     Breath sounds: Normal breath sounds. No wheezing, rhonchi or rales.  Abdominal:     Palpations: Abdomen is soft.     Tenderness: There is no abdominal tenderness. There is no guarding or rebound.  Musculoskeletal:     Cervical back: Normal range of motion and neck supple. No rigidity or tenderness.     Comments: RUE significantly more swollen compared to LUE. Swelling appears to begin at the midhumerus and extends down to the hand. No erythema or increased warmth. + diffuse TTP where the swelling is located. 2+ radial pulse. ROM intact to shoulder, elbow, and wrist. Cap refill  < 2 seconds.   Skin:    General: Skin is warm and dry.  Neurological:     Mental Status: He is alert.     ED Results / Procedures / Treatments   Labs (all labs ordered are listed, but only abnormal results are displayed) Labs Reviewed  CBC WITH DIFFERENTIAL/PLATELET - Abnormal; Notable for the following components:      Result Value   RBC 3.76 (*)    Hemoglobin 11.3 (*)    HCT 35.3 (*)    RDW 16.8 (*)    All other components within normal limits  BASIC METABOLIC PANEL  CK    EKG None  Radiology CT Extrem Up Entire Arm R WO/CM  Result Date: 06/07/2020 CLINICAL DATA:  Right upper extremity swelling. EXAM: CT OF THE UPPER RIGHT EXTREMITY WITHOUT CONTRAST TECHNIQUE: Multidetector CT imaging of the upper right extremity was performed according to the standard protocol. COMPARISON:  None. FINDINGS: Bones/Joint/Cartilage There is no acute displaced fracture or dislocation. Ligaments Suboptimally assessed by CT. Muscles and Tendons There is no definite muscular abnormality, however evaluation is limited by lack of IV contrast. Soft tissues There is a partially visualized left-sided HERO graft with tip terminating in the right atrium. There is a right brachiocephalic and subclavian vein stent, the patency of which cannot be determined on this study. The stents appear compressed at the level of the first ribs and right clavicle. Multiple venous collaterals are noted in the chest wall, especially on the right. The heart size is enlarged. There are coronary artery calcifications. There are atherosclerotic changes of the thoracic aorta. There is an enlarged mediastinal lymph node measuring approximately 1.9 cm (axial series 7, image 108). There is heterogeneous right gynecomastia. There is extensive right upper extremity subcutaneous edema. There is no well-formed drainable fluid collection. In the right upper extremity. The native right kidney is atrophic consistent with the patient's history of  end-stage renal disease. There is a right pelvic transplant kidney in place. Adjacent to the pelvic transplant kidney on the right there is an air and fluid collection measuring approximately 4.8 by 5.2 by 3.7 cm. This is suboptimally evaluated in the absence of IV contrast. There are some adjacent inflammatory changes. There are atherosclerotic changes of the visualized abdominal aorta. There is a probable failed transplant kidney in the left hemipelvis. There are few prominent lymph nodes in the right axilla. A metallic coils noted at the level of the right forearm, perhaps from prior embolization. IMPRESSION: 1. Extensive right upper extremity nonspecific edema is noted. There is no well-formed drainable fluid collection. There is no acute displaced fracture or dislocation. 2. There are stents within the right subclavian vein and right brachiocephalic vein. Given the right upper extremity edema, these may be occluded. They appear to be compressed at the level of the first rib and right clavicle. The patency of the stents cannot be determined on this study. 3. Right pelvic transplant kidney with an adjacent air and fluid collection suspicious for an abscess. This is not well evaluated in the absence of IV contrast. 4. Enlarged mediastinal lymph node as detailed above. A three-month follow-up CT of the chest is recommended to confirm stability or resolution of this finding. 5. Right gynecomastia with mild right axillary lymphadenopathy. Correlation with outpatient mammography is recommended. 6. Multiple additional chronic findings are noted as detailed above. Aortic Atherosclerosis (ICD10-I70.0). These results were called by telephone at the time of interpretation on 06/07/2020 at 6:34 pm to provider Shalay Carder , who verbally acknowledged these results. Electronically Signed   By: Katherine Mantle M.D.   On: 06/07/2020 18:35    Procedures Procedures (including critical care time)  Medications Ordered in  ED Medications  hydrocortisone sodium succinate (SOLU-CORTEF) injection 200 mg (has no administration in time range)  diphenhydrAMINE (BENADRYL) capsule 50 mg (has no administration in time range)    Or  diphenhydrAMINE (BENADRYL) injection 50 mg (has no administration in time range)  HYDROmorphone (DILAUDID) injection 1 mg (1 mg Intravenous Given 06/07/20 1834)    ED Course  I have reviewed the triage vital signs and the nursing notes.  Pertinent labs & imaging results that were available during my care of the patient were reviewed by me and considered in my medical decision making (see chart for details).    MDM Rules/Calculators/A&P                          54 year old male status post kidney transplant who presents to the ED today with complaint of nodule onset of right upper extremity swelling for the past 3 weeks with 2 - DVT studies, most recent on 12/18 in outside ED.  Sent to ED today by PCP for further evaluation.  Patient denies any trauma to his arm.  No recent IV sticks prior to swelling occurring to suggest thrombophlebitis.  No fevers or chills, extremity does not seem consistent with cellulitis.  Patient has good distal pulses in this arm with good perfusion however he does have extensive swelling to the right upper extremity compared to the left.  Swelling appears to begin at the mid humeral area and extends all the way down to the fingers.  He denies any chest pain or shortness of breath and has no tenderness palpation of these areas.  His vitals are stable on arrival to the ED and he is nontachycardic.  He do not currently have ultrasound here at this time however I do not think a third ultrasound will be of benefit at this time.  There is concern for some sort of clot causing all the swelling, will plan to discuss with radiologist to assess for this type of imaging at this time.  We will plan for lab work as well.  Discussed case with radiologist Dr. Gwenyth Bender who recommends  CT noncontrast of RUE with extension to the subclavian/suprahilar region.   Received call from radiologist Dr. Chilton Si; pt appears to have stents in the right subclavian and right brachiocephalic vein that may be occluded; they are placed in between the clavicle and first rib and he states these are easy to compress and occlude with any type of arm movement. This may explain swelling - he recommends a CT chest with contrast to further assess. Incidentally he has also appreciated air and fluid abutting the transplanted kidney on the right side with concern for possible abscess vs rupture s/2 kidney stone vs appendicitis rupture. Recommends CT A/P with contrast for further evaluation. Will need to await pt's kidney function before ordering contrast and pt will need to be premedicated.   At shift change case signed out to attending physician Dr. Stevie Kern who will order the CT's pending pt's kidney function with premedication for IV contrast.   This note was prepared using Dragon voice recognition software and may include unintentional dictation errors due to the inherent limitations of voice recognition software.  Final Clinical Impression(s) / ED Diagnoses Final diagnoses:  None    Rx / DC Orders ED Discharge Orders    None       Tanda Rockers, PA-C 06/07/20 2000    Milagros Loll, MD 06/07/20 2350

## 2020-06-07 NOTE — ED Notes (Signed)
Attempted phlebotomy to left hand,  Unsuccessful.  Attempted to palpate vein in right arm, unable to find due to excessive swelling.

## 2020-06-08 ENCOUNTER — Encounter (HOSPITAL_BASED_OUTPATIENT_CLINIC_OR_DEPARTMENT_OTHER): Payer: Self-pay | Admitting: Radiology

## 2020-06-08 LAB — URINALYSIS, ROUTINE W REFLEX MICROSCOPIC
Bilirubin Urine: NEGATIVE
Glucose, UA: NEGATIVE mg/dL
Hgb urine dipstick: NEGATIVE
Ketones, ur: NEGATIVE mg/dL
Leukocytes,Ua: NEGATIVE
Nitrite: NEGATIVE
Protein, ur: NEGATIVE mg/dL
Specific Gravity, Urine: 1.015 (ref 1.005–1.030)
pH: 7 (ref 5.0–8.0)

## 2020-06-08 MED ORDER — IOHEXOL 300 MG/ML  SOLN
100.0000 mL | Freq: Once | INTRAMUSCULAR | Status: AC | PRN
  Administered 2020-06-08: 100 mL via INTRAVENOUS

## 2020-06-08 MED ORDER — HYDROMORPHONE HCL 1 MG/ML IJ SOLN
1.0000 mg | Freq: Once | INTRAMUSCULAR | Status: AC
  Administered 2020-06-08: 1 mg via INTRAVENOUS
  Filled 2020-06-08: qty 1

## 2020-06-08 NOTE — Discharge Instructions (Addendum)
You were seen today for right arm swelling.  The cause is still unknown.  It is very important that you follow-up with your primary physicians and transplant physicians.  You may need traditional angiography to evaluate your stents.  Additionally, you have a persistent fluid collection around your kidney.  Again it is very important that you follow-up with your transplant team.  Call first thing in the morning to be seen as soon as possible.

## 2020-06-08 NOTE — ED Provider Notes (Signed)
Patient signed out pending CTA of the chest and abdomen.  In brief presents with right upper extremity swelling.  Had outside DVT studies that were negative on 12/18.  He has stents in the right subclavian.  He was found on noncontrast CT to also have a lesion adjacent to his kidney that was concerning for abscess.  CT of the chest and abdomen ordered.  Received a call from radiology.  He has a 6.6 x 3.5 x 7.8 cm fluid collection concerning for abscess with perinephric fat stranding adjacent to his transplanted kidney.  Regarding his CTA of the chest, exam is suboptimal and it is difficult to assess patency of the stents.  Recommended DVT study and possible conventional angiography.  I have reviewed the patient's chart from [facility].  I cannot review imaging; however, it does appear that he had a peritransplant fluid collection following transplant for which a drain was placed and he was placed on antibiotics.  His initial fluid collection was 14 x 10 x 13 cm.  Drain was removed in November.  Patient on recheck denies any fevers, abdominal pain.  He has no significant leukocytosis.    2:43 AM Spoke with Dr.Phadke, at [facility] regarding the patient.  Given that the patient is clinically stable, he recommends that the patient call clinic for follow-up with the transplant team first thing in the morning.  He does not feel that he needs emergent intervention given that he has a known fluid collection previously.  I discussed with him the right upper extremity findings as well.  I have reviewed his chart and do see where he had a documented negative DVT study.  While he has diffuse swelling, he is neurovascularly intact at this time with good capillary refill and good radial pulses.  Will defer further imaging and work-up to the transplant team as they know him best.  I discussed follow-up with the patient and stressed the importance that he call first thing in the morning.  I also gave him strict  return precautions if he has any new or worsening symptoms.  Patient stated understanding   Physical Exam  BP (!) 157/88 (BP Location: Right Leg)    Pulse 74    Temp 98.2 F (36.8 C) (Oral)    Resp 18    Ht 1.854 m (6\' 1" )    Wt 95.3 kg    SpO2 99%    BMI 27.71 kg/m       , MD 06/08/20 [phone]

## 2021-07-07 DIAGNOSIS — I517 Cardiomegaly: Secondary | ICD-10-CM | POA: Diagnosis not present

## 2021-07-18 ENCOUNTER — Emergency Department (HOSPITAL_BASED_OUTPATIENT_CLINIC_OR_DEPARTMENT_OTHER): Payer: Medicare Other

## 2021-07-18 ENCOUNTER — Inpatient Hospital Stay (HOSPITAL_BASED_OUTPATIENT_CLINIC_OR_DEPARTMENT_OTHER)
Admission: EM | Admit: 2021-07-18 | Discharge: 2021-07-21 | DRG: 315 | Disposition: A | Discharge status: Home / Self Care | Payer: Medicare Other | Attending: Internal Medicine | Admitting: Internal Medicine

## 2021-07-18 ENCOUNTER — Encounter (HOSPITAL_BASED_OUTPATIENT_CLINIC_OR_DEPARTMENT_OTHER): Payer: Self-pay

## 2021-07-18 ENCOUNTER — Other Ambulatory Visit: Payer: Self-pay

## 2021-07-18 DIAGNOSIS — E785 Hyperlipidemia, unspecified: Secondary | ICD-10-CM

## 2021-07-18 DIAGNOSIS — Z79899 Other long term (current) drug therapy: Secondary | ICD-10-CM

## 2021-07-18 DIAGNOSIS — Z91041 Radiographic dye allergy status: Secondary | ICD-10-CM

## 2021-07-18 DIAGNOSIS — Z87891 Personal history of nicotine dependence: Secondary | ICD-10-CM

## 2021-07-18 DIAGNOSIS — Z8673 Personal history of transient ischemic attack (TIA), and cerebral infarction without residual deficits: Secondary | ICD-10-CM

## 2021-07-18 DIAGNOSIS — M109 Gout, unspecified: Secondary | ICD-10-CM | POA: Diagnosis present

## 2021-07-18 DIAGNOSIS — Y838 Other surgical procedures as the cause of abnormal reaction of the patient, or of later complication, without mention of misadventure at the time of the procedure: Secondary | ICD-10-CM | POA: Diagnosis present

## 2021-07-18 DIAGNOSIS — E1165 Type 2 diabetes mellitus with hyperglycemia: Secondary | ICD-10-CM | POA: Diagnosis not present

## 2021-07-18 DIAGNOSIS — Y713 Surgical instruments, materials and cardiovascular devices (including sutures) associated with adverse incidents: Secondary | ICD-10-CM | POA: Diagnosis present

## 2021-07-18 DIAGNOSIS — D696 Thrombocytopenia, unspecified: Secondary | ICD-10-CM | POA: Diagnosis present

## 2021-07-18 DIAGNOSIS — T82858A Stenosis of vascular prosthetic devices, implants and grafts, initial encounter: Secondary | ICD-10-CM | POA: Diagnosis not present

## 2021-07-18 DIAGNOSIS — E1142 Type 2 diabetes mellitus with diabetic polyneuropathy: Secondary | ICD-10-CM | POA: Diagnosis present

## 2021-07-18 DIAGNOSIS — Z20822 Contact with and (suspected) exposure to covid-19: Secondary | ICD-10-CM | POA: Diagnosis present

## 2021-07-18 DIAGNOSIS — T380X5A Adverse effect of glucocorticoids and synthetic analogues, initial encounter: Secondary | ICD-10-CM | POA: Diagnosis not present

## 2021-07-18 DIAGNOSIS — Z7960 Long term (current) use of unspecified immunomodulators and immunosuppressants: Secondary | ICD-10-CM

## 2021-07-18 DIAGNOSIS — I82B19 Acute embolism and thrombosis of unspecified subclavian vein: Secondary | ICD-10-CM | POA: Diagnosis present

## 2021-07-18 DIAGNOSIS — I10 Essential (primary) hypertension: Secondary | ICD-10-CM | POA: Diagnosis present

## 2021-07-18 DIAGNOSIS — Z940 Kidney transplant status: Secondary | ICD-10-CM

## 2021-07-18 DIAGNOSIS — G629 Polyneuropathy, unspecified: Secondary | ICD-10-CM

## 2021-07-18 DIAGNOSIS — M549 Dorsalgia, unspecified: Secondary | ICD-10-CM | POA: Diagnosis present

## 2021-07-18 DIAGNOSIS — I82B11 Acute embolism and thrombosis of right subclavian vein: Secondary | ICD-10-CM

## 2021-07-18 DIAGNOSIS — E119 Type 2 diabetes mellitus without complications: Secondary | ICD-10-CM

## 2021-07-18 DIAGNOSIS — M79601 Pain in right arm: Secondary | ICD-10-CM | POA: Diagnosis not present

## 2021-07-18 DIAGNOSIS — G8929 Other chronic pain: Secondary | ICD-10-CM | POA: Diagnosis present

## 2021-07-18 HISTORY — DX: Cerebral infarction, unspecified: I63.9

## 2021-07-18 LAB — CBC WITH DIFFERENTIAL/PLATELET
Abs Immature Granulocytes: 0.03 10*3/uL (ref 0.00–0.07)
Basophils Absolute: 0 10*3/uL (ref 0.0–0.1)
Basophils Relative: 0 %
Eosinophils Absolute: 0 10*3/uL (ref 0.0–0.5)
Eosinophils Relative: 0 %
HCT: 43.3 % (ref 39.0–52.0)
Hemoglobin: 14.2 g/dL (ref 13.0–17.0)
Immature Granulocytes: 1 %
Lymphocytes Relative: 9 %
Lymphs Abs: 0.5 10*3/uL — ABNORMAL LOW (ref 0.7–4.0)
MCH: 30 pg (ref 26.0–34.0)
MCHC: 32.8 g/dL (ref 30.0–36.0)
MCV: 91.5 fL (ref 80.0–100.0)
Monocytes Absolute: 0.4 10*3/uL (ref 0.1–1.0)
Monocytes Relative: 7 %
Neutro Abs: 5 10*3/uL (ref 1.7–7.7)
Neutrophils Relative %: 83 %
Platelets: 145 10*3/uL — ABNORMAL LOW (ref 150–400)
RBC: 4.73 MIL/uL (ref 4.22–5.81)
RDW: 14.2 % (ref 11.5–15.5)
WBC: 5.9 10*3/uL (ref 4.0–10.5)
nRBC: 0 % (ref 0.0–0.2)

## 2021-07-18 LAB — RESP PANEL BY RT-PCR (FLU A&B, COVID) ARPGX2
Influenza A by PCR: NEGATIVE
Influenza B by PCR: NEGATIVE
SARS Coronavirus 2 by RT PCR: NEGATIVE

## 2021-07-18 LAB — BASIC METABOLIC PANEL
Anion gap: 9 (ref 5–15)
BUN: 19 mg/dL (ref 6–20)
CO2: 23 mmol/L (ref 22–32)
Calcium: 9.1 mg/dL (ref 8.9–10.3)
Chloride: 104 mmol/L (ref 98–111)
Creatinine, Ser: 1.35 mg/dL — ABNORMAL HIGH (ref 0.61–1.24)
GFR, Estimated: >60 mL/min (ref >60)
Glucose, Bld: 99 mg/dL (ref 70–99)
Potassium: 3.8 mmol/L (ref 3.5–5.1)
Sodium: 136 mmol/L (ref 135–145)

## 2021-07-18 MED ORDER — HYDROCORTISONE SOD SUC (PF) 250 MG IJ SOLR
200.0000 mg | Freq: Once | INTRAMUSCULAR | Status: DC
  Filled 2021-07-18: qty 200

## 2021-07-18 MED ORDER — DIPHENHYDRAMINE HCL 25 MG PO CAPS: 50.0000 mg | ORAL_CAPSULE | Freq: Once | ORAL | Status: DC

## 2021-07-18 MED ORDER — HYDROMORPHONE HCL 1 MG/ML IJ SOLN
2.0000 mg | Freq: Once | INTRAMUSCULAR | Status: AC
  Administered 2021-07-18: 2 mg via INTRAMUSCULAR
  Filled 2021-07-18: qty 2

## 2021-07-18 MED ORDER — OXYCODONE HCL 5 MG PO TABS
5.0000 mg | ORAL_TABLET | ORAL | Status: DC | PRN
  Administered 2021-07-18 – 2021-07-19 (×4): 5 mg via ORAL
  Filled 2021-07-18 (×4): qty 1

## 2021-07-18 MED ORDER — DIPHENHYDRAMINE HCL 50 MG/ML IJ SOLN: 50.0000 mg | Freq: Once | INTRAMUSCULAR | Status: DC

## 2021-07-18 MED ORDER — OXYCODONE-ACETAMINOPHEN 5-325 MG PO TABS
1.0000 | ORAL_TABLET | ORAL | Status: DC | PRN
  Administered 2021-07-18 – 2021-07-19 (×4): 1 via ORAL
  Filled 2021-07-18 (×4): qty 1

## 2021-07-18 NOTE — ED Notes (Signed)
After triage pt advised of wait-states he is going to leave and go to another ED if he has to wait-also states he attempted to be seen by PCP and was denied appt due to outstanding bill-advised to let staff know if he decides to leave

## 2021-07-18 NOTE — ED Triage Notes (Signed)
Pt c/o increase pain to right UE and decreased movement x 1 week-states sx worse after he was admitted to [facility] for "mimi stroke" last week-NAD-steady gait-reports BP to be taken left ankle

## 2021-07-18 NOTE — ED Notes (Signed)
Reached out to Radiology about size and location of IV for venogram.  They are researching it and will follow up

## 2021-07-18 NOTE — ED Provider Notes (Addendum)
[facility] EMERGENCY DEPARTMENT Provider Note   CSN: [id number] Arrival date & time: 07/18/21  1233     History  Chief Complaint  Patient presents with   Arm Pain    Nicholas Green is a 56 y.o. male.  HPI 56 year old male presents with acute on chronic right upper extremity pain and swelling.  He states that over the last week or so he feels like his right distal forearm and hand are swollen.  It is painful to move his wrist.  He denies any redness or fevers.  He chronically has lymphedema in that arm.  He went to [facility] night and was discharged with 10-3 25 Percocet but he states is not helping.  He thinks he needs a shot for pain.  He denies any injuries.  No recent illness or vomiting.  He states he was diagnosed with a TIA about a week ago and that is when the symptoms seem to get worse.  Home Medications Prior to Admission medications   Medication Sig Start Date End Date Taking? Authorizing Provider  brimonidine-timolol (COMBIGAN) 0.2-0.5 % ophthalmic solution Place 1 drop into both eyes every 12 (twelve) hours.    [provider]  calcitRIOL (ROCALTROL) 0.25 MCG capsule Take 0.25 mcg by mouth daily.    [provider]  calcium carbonate (TUMS - DOSED IN MG ELEMENTAL CALCIUM) 500 MG chewable tablet Chew 1-2 tablets by mouth 3 (three) times daily as needed for indigestion or heartburn.    [provider]  cinacalcet (SENSIPAR) 60 MG tablet Take 60 mg by mouth daily.    [provider]  cloNIDine (CATAPRES - DOSED IN MG/24 HR) 0.3 mg/24hr patch Place 0.3 mg onto the skin every Sunday.    [provider]  ferric citrate (AURYXIA) 1 GM 210 MG(Fe) tablet Take 420-840 mg by mouth See admin instructions. Take 2 tablets (420 mg) by mouth with each snack & take 4 tablets (840 mg) by mouth with each meal    [provider]  hydrALAZINE (APRESOLINE) 100 MG tablet Take 100 mg by mouth 3 (three) times daily.  04/10/18   [provider]  nitroGLYCERIN (NITROSTAT) 0.4 MG SL tablet Place 0.4 mg under the tongue every 5 (five) minutes as needed for chest pain.     [provider]  potassium chloride SA (K-DUR,KLOR-CON) 20 MEQ tablet Take 20 mEq by mouth daily.    [provider]      Allergies    Ivp dye [iodinated contrast media]    Review of Systems   Review of Systems  Constitutional:  Negative for fever.  Cardiovascular:  Negative for chest pain.  Gastrointestinal:  Negative for vomiting.  Musculoskeletal:  Positive for arthralgias.  Skin:  Negative for color change.  Neurological:  Negative for weakness and numbness.   Physical Exam Updated Vital Signs BP (!) 146/85    Pulse 61    Temp 98.5 F (36.9 C) (Oral)    Resp 18    Ht 6\' 1"  (1.854 m)    Wt 102.5 kg    SpO2 95%    BMI 29.82 kg/m  Physical Exam Vitals and nursing note reviewed.  Constitutional:      General: He is not in acute distress.    Appearance: He is well-developed. He is not ill-appearing or diaphoretic.  HENT:     Head: Normocephalic and atraumatic.  Cardiovascular:     Rate and Rhythm: Normal rate and regular rhythm.  Pulses:          Radial pulses are 2+ on the right side.     Heart sounds: Normal heart sounds.  Pulmonary:     Effort: Pulmonary effort is normal.     Breath sounds: Normal breath sounds.  Abdominal:     General: There is no distension.  Musculoskeletal:     Comments: Diffuse lymphedema to the right upper extremity.  There is significant swelling throughout but not particularly worse in the hand or wrist compared to the rest.  Intact radial pulse.  He is able to move all 5 fingers and has grossly normal sensation.  He has a lot of pain when trying to move the wrist.  No cellulitis or increased warmth.  Skin:    General: Skin is warm and dry.  Neurological:     Mental Status: He is alert.    ED Results / Procedures / Treatments   Labs (all labs ordered are  listed, but only abnormal results are displayed) Labs Reviewed  BASIC METABOLIC PANEL - Abnormal; Notable for the following components:      Result Value   Creatinine, Ser 1.35 (*)    All other components within normal limits  CBC WITH DIFFERENTIAL/PLATELET - Abnormal; Notable for the following components:   Platelets 145 (*)    Lymphs Abs 0.5 (*)    All other components within normal limits  RESP PANEL BY RT-PCR (FLU A&B, COVID) [id number]    EKG None  Radiology DG Wrist Complete Right  Result Date: 07/18/2021 CLINICAL DATA:  right arm swelling/pain EXAM: RIGHT WRIST - COMPLETE 3+ VIEW COMPARISON:  CT 06/07/2020 FINDINGS: There is no evidence of fracture or dislocation. No bony erosive change or focal osteopenia. Obliquity limits evaluation on the lateral radiograph. Mild first CMC and triscaphe degenerative change. Soft tissues edema. Small (3 mm) linear radiopaque foreign body within the volar and radial soft tissues at the level of the wrist. IMPRESSION: 1. Soft tissue edema without evidence of acute osseous abnormality. 2. Small (3 mm) linear radiopaque foreign body within the volar and radial soft tissues at the level of the wrist. 3. Mild first CMC and triscaphe degenerative change. Electronically Signed   By: Feliberto Harts M.D.   On: 07/18/2021 15:42   US Venous Img Upper Right (DVT Study)  Result Date: 07/18/2021 CLINICAL DATA:  56 year old male with right upper extremity swelling. EXAM: RIGHT UPPER EXTREMITY VENOUS DOPPLER ULTRASOUND TECHNIQUE: Gray-scale sonography with graded compression, as well as color Doppler and duplex ultrasound were performed to evaluate the upper extremity deep venous system from the level of the subclavian vein and including the jugular, axillary, basilic, radial, ulnar and upper cephalic vein. Spectral Doppler was utilized to evaluate flow at rest and with distal augmentation maneuvers. COMPARISON:  CT chest from 06/08/2020, CT head and neck from  07/07/2021 FINDINGS: Contralateral Subclavian Vein: Respiratory phasicity is normal and symmetric with the symptomatic side. No evidence of thrombus. Normal compressibility. Internal Jugular Vein: No evidence of thrombus. Normal compressibility, respiratory phasicity and response to augmentation. Subclavian Vein: No evidence of thrombus. Absence of respiratory phasicity. Axillary Vein: No evidence of thrombus. Absence of respiratory phasicity. Cephalic Vein: No evidence of thrombus. Absence of respiratory phasicity. Normal compressibility. Basilic Vein: No evidence of thrombus. Absence of respiratory phasicity. Normal compressibility. Brachial Veins: No evidence of thrombus. Absence of respiratory phasicity. Normal compressibility. Radial Veins: No evidence of thrombus. Normal compressibility, respiratory phasicity and response to augmentation. Ulnar Veins: No evidence of thrombus.  Normal compressibility, respiratory phasicity and response to augmentation. Other Findings:  Diffuse right upper extremity subcutaneous edema. IMPRESSION: No evidence of right upper extremity deep vein thrombosis. 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 Consider CT venogram chest for further characterization and referral to Interventional Radiology or other qualified endovascular specialist. These results were called by telephone at the time of interpretation on 07/18/2021 at 4:27 pm to provider Pricilla Loveless , who verbally acknowledged these results. Marliss Coots, MD [facility] Electronically Signed   By: Marliss Coots M.D.   On: 07/18/2021 16:27    Procedures Procedures    Medications Ordered in ED Medications  oxyCODONE-acetaminophen (PERCOCET/ROXICET) 5-325 MG per tablet 1 tablet (1 tablet Oral Given 07/18/21 2131)     And  oxyCODONE (Oxy IR/ROXICODONE) immediate release tablet 5 mg (5 mg Oral Given 07/18/21 2131)  HYDROmorphone (DILAUDID) injection 2 mg (2 mg Intramuscular Given 07/18/21 1544)    ED Course/ Medical Decision Making/ A&P                           Medical Decision Making Amount and/or Complexity of Data Reviewed Labs: ordered. Radiology: ordered. ECG/medicine tests: ordered.  Risk Prescription drug management. Decision regarding hospitalization.   Patient's ultrasound seems to show occlusion of his stents proximally.  I discussed with the radiologist, Dr. Elby Showers, and this could be causing worsening edema/outlet syndrome.  Given this as well as patient's uncontrolled pain, we discussed the case and agree that he should get an inpatient procedure, could be in the morning on 1/31.  Dr. Elby Showers is also asking for CT venogram of the chest.  This will be somewhat delayed due to the contrast allergy he is has, though he states that when he gets premedicated it is okay.  X-ray was personally reviewed by myself and does seem to show a small foreign body but no fractures.  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  On the underside of his arm there is no obvious cellulitis.  I doubt this is causing his symptoms today.  Labs reviewed/interpreted by myself: no significant change from baseline  D/w Dr. Cyndia Bent for admission to [facility].      Addendum: on 1/30 Dr. Elby Showers asked for CT venogram if possible, but if not able they could still proceed. 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

## 2021-07-18 NOTE — ED Notes (Signed)
Two additional pillows placed under pt's R arm

## 2021-07-18 NOTE — ED Notes (Addendum)
Check with renal floor and ICU at [facility] regarding IV in left arm.  It is contraindicated per policy.   Cannot put an IV in right arm due to severe lymphedema.  Dr. Criss Alvine made aware.

## 2021-07-19 ENCOUNTER — Encounter (HOSPITAL_COMMUNITY): Payer: Self-pay | Admitting: Internal Medicine

## 2021-07-19 DIAGNOSIS — E119 Type 2 diabetes mellitus without complications: Secondary | ICD-10-CM

## 2021-07-19 DIAGNOSIS — E785 Hyperlipidemia, unspecified: Secondary | ICD-10-CM | POA: Diagnosis present

## 2021-07-19 DIAGNOSIS — I10 Essential (primary) hypertension: Secondary | ICD-10-CM | POA: Diagnosis present

## 2021-07-19 DIAGNOSIS — Z79899 Other long term (current) drug therapy: Secondary | ICD-10-CM | POA: Diagnosis not present

## 2021-07-19 DIAGNOSIS — Y713 Surgical instruments, materials and cardiovascular devices (including sutures) associated with adverse incidents: Secondary | ICD-10-CM | POA: Diagnosis present

## 2021-07-19 DIAGNOSIS — E1142 Type 2 diabetes mellitus with diabetic polyneuropathy: Secondary | ICD-10-CM | POA: Diagnosis present

## 2021-07-19 DIAGNOSIS — Z91041 Radiographic dye allergy status: Secondary | ICD-10-CM | POA: Diagnosis not present

## 2021-07-19 DIAGNOSIS — D696 Thrombocytopenia, unspecified: Secondary | ICD-10-CM | POA: Diagnosis present

## 2021-07-19 DIAGNOSIS — Z940 Kidney transplant status: Secondary | ICD-10-CM | POA: Diagnosis not present

## 2021-07-19 DIAGNOSIS — G8929 Other chronic pain: Secondary | ICD-10-CM | POA: Diagnosis present

## 2021-07-19 DIAGNOSIS — M549 Dorsalgia, unspecified: Secondary | ICD-10-CM | POA: Diagnosis present

## 2021-07-19 DIAGNOSIS — Z8673 Personal history of transient ischemic attack (TIA), and cerebral infarction without residual deficits: Secondary | ICD-10-CM | POA: Diagnosis not present

## 2021-07-19 DIAGNOSIS — E1165 Type 2 diabetes mellitus with hyperglycemia: Secondary | ICD-10-CM | POA: Diagnosis not present

## 2021-07-19 DIAGNOSIS — I82B11 Acute embolism and thrombosis of right subclavian vein: Secondary | ICD-10-CM | POA: Diagnosis not present

## 2021-07-19 DIAGNOSIS — T380X5A Adverse effect of glucocorticoids and synthetic analogues, initial encounter: Secondary | ICD-10-CM | POA: Diagnosis not present

## 2021-07-19 DIAGNOSIS — Y838 Other surgical procedures as the cause of abnormal reaction of the patient, or of later complication, without mention of misadventure at the time of the procedure: Secondary | ICD-10-CM | POA: Diagnosis present

## 2021-07-19 DIAGNOSIS — T82858A Stenosis of vascular prosthetic devices, implants and grafts, initial encounter: Secondary | ICD-10-CM | POA: Diagnosis present

## 2021-07-19 DIAGNOSIS — M109 Gout, unspecified: Secondary | ICD-10-CM | POA: Diagnosis present

## 2021-07-19 DIAGNOSIS — M79601 Pain in right arm: Secondary | ICD-10-CM | POA: Diagnosis present

## 2021-07-19 DIAGNOSIS — Z20822 Contact with and (suspected) exposure to covid-19: Secondary | ICD-10-CM | POA: Diagnosis present

## 2021-07-19 DIAGNOSIS — Z7960 Long term (current) use of unspecified immunomodulators and immunosuppressants: Secondary | ICD-10-CM | POA: Diagnosis not present

## 2021-07-19 DIAGNOSIS — Z87891 Personal history of nicotine dependence: Secondary | ICD-10-CM | POA: Diagnosis not present

## 2021-07-19 LAB — RENAL FUNCTION PANEL
Albumin: 3.8 g/dL (ref 3.5–5.0)
Anion gap: 8 (ref 5–15)
BUN: 17 mg/dL (ref 6–20)
CO2: 23 mmol/L (ref 22–32)
Calcium: 9.4 mg/dL (ref 8.9–10.3)
Chloride: 108 mmol/L (ref 98–111)
Creatinine, Ser: 1.41 mg/dL — ABNORMAL HIGH (ref 0.61–1.24)
GFR, Estimated: 58 mL/min — ABNORMAL LOW (ref >60)
Glucose, Bld: 116 mg/dL — ABNORMAL HIGH (ref 70–99)
Phosphorus: 2.2 mg/dL — ABNORMAL LOW (ref 2.5–4.6)
Potassium: 4.1 mmol/L (ref 3.5–5.1)
Sodium: 139 mmol/L (ref 135–145)

## 2021-07-19 LAB — HIV ANTIBODY (ROUTINE TESTING W REFLEX): HIV Screen 4th Generation wRfx: NONREACTIVE

## 2021-07-19 MED ORDER — PREDNISONE 5 MG PO TABS
5.0000 mg | ORAL_TABLET | Freq: Every day | ORAL | Status: DC
  Administered 2021-07-21: 5 mg via ORAL
  Filled 2021-07-19 (×4): qty 1

## 2021-07-19 MED ORDER — SODIUM CHLORIDE 0.9% FLUSH
3.0000 mL | Freq: Two times a day (BID) | INTRAVENOUS | Status: DC
  Administered 2021-07-20 – 2021-07-21 (×2): 3 mL via INTRAVENOUS

## 2021-07-19 MED ORDER — GABAPENTIN 100 MG PO CAPS
100.0000 mg | ORAL_CAPSULE | Freq: Every day | ORAL | Status: DC
Start: 2021-07-19 — End: 2021-07-21
  Administered 2021-07-19 – 2021-07-20 (×2): 100 mg via ORAL
  Filled 2021-07-19 (×2): qty 1

## 2021-07-19 MED ORDER — GABAPENTIN 300 MG PO CAPS
300.0000 mg | ORAL_CAPSULE | Freq: Three times a day (TID) | ORAL | Status: DC
  Administered 2021-07-19 – 2021-07-21 (×6): 300 mg via ORAL
  Filled 2021-07-19 (×6): qty 1

## 2021-07-19 MED ORDER — ACETAMINOPHEN 325 MG PO TABS: 650.0000 mg | ORAL_TABLET | Freq: Four times a day (QID) | ORAL | Status: DC | PRN

## 2021-07-19 MED ORDER — DIPHENHYDRAMINE HCL 50 MG/ML IJ SOLN: 50.0000 mg | Freq: Once | INTRAMUSCULAR | Status: AC

## 2021-07-19 MED ORDER — TACROLIMUS ER 1 MG PO TB24
1.0000 mg | ORAL_TABLET | Freq: Every day | ORAL | Status: DC
Start: 2021-07-19 — End: 2021-07-21
  Administered 2021-07-20 – 2021-07-21 (×2): 1 mg via ORAL
  Filled 2021-07-19 (×3): qty 1

## 2021-07-19 MED ORDER — PREDNISONE 5 MG PO TABS
50.0000 mg | ORAL_TABLET | Freq: Four times a day (QID) | ORAL | Status: AC
  Administered 2021-07-19 (×3): 50 mg via ORAL
  Filled 2021-07-19: qty 1
  Filled 2021-07-19 (×2): qty 2

## 2021-07-19 MED ORDER — DIPHENHYDRAMINE HCL 25 MG PO CAPS
50.0000 mg | ORAL_CAPSULE | Freq: Once | ORAL | Status: AC
  Administered 2021-07-19: 50 mg via ORAL
  Filled 2021-07-19: qty 2

## 2021-07-19 MED ORDER — TRAZODONE HCL 50 MG PO TABS
50.0000 mg | ORAL_TABLET | Freq: Every evening | ORAL | Status: DC | PRN
  Administered 2021-07-19 – 2021-07-20 (×2): 50 mg via ORAL
  Filled 2021-07-19 (×2): qty 1

## 2021-07-19 MED ORDER — ONDANSETRON HCL 4 MG/2ML IJ SOLN: 4.0000 mg | Freq: Four times a day (QID) | INTRAMUSCULAR | Status: DC | PRN

## 2021-07-19 MED ORDER — HYDROMORPHONE HCL 2 MG PO TABS
2.0000 mg | ORAL_TABLET | Freq: Four times a day (QID) | ORAL | Status: DC | PRN
  Administered 2021-07-19 – 2021-07-21 (×4): 2 mg via ORAL
  Filled 2021-07-19 (×4): qty 1

## 2021-07-19 MED ORDER — ONDANSETRON HCL 4 MG PO TABS: 4.0000 mg | ORAL_TABLET | Freq: Four times a day (QID) | ORAL | Status: DC | PRN

## 2021-07-19 MED ORDER — MYCOPHENOLATE SODIUM 180 MG PO TBEC
720.0000 mg | DELAYED_RELEASE_TABLET | Freq: Two times a day (BID) | ORAL | Status: DC
  Administered 2021-07-19 – 2021-07-21 (×4): 720 mg via ORAL
  Filled 2021-07-19 (×6): qty 4

## 2021-07-19 MED ORDER — ASPIRIN 81 MG PO CHEW
81.0000 mg | CHEWABLE_TABLET | Freq: Every day | ORAL | Status: DC
  Administered 2021-07-19 – 2021-07-21 (×3): 81 mg via ORAL
  Filled 2021-07-19 (×3): qty 1

## 2021-07-19 MED ORDER — HYDROMORPHONE HCL 1 MG/ML IJ SOLN
0.5000 mg | INTRAMUSCULAR | Status: DC | PRN
Start: 2021-07-19 — End: 2021-07-21
  Administered 2021-07-20 – 2021-07-21 (×3): 0.5 mg via INTRAVENOUS
  Filled 2021-07-19 (×3): qty 0.5

## 2021-07-19 MED ORDER — PREDNISONE 5 MG PO TABS
50.0000 mg | ORAL_TABLET | Freq: Four times a day (QID) | ORAL | Status: AC
  Administered 2021-07-20 (×3): 50 mg via ORAL
  Filled 2021-07-19 (×3): qty 2

## 2021-07-19 MED ORDER — ALBUTEROL SULFATE (2.5 MG/3ML) 0.083% IN NEBU: 2.5000 mg | INHALATION_SOLUTION | Freq: Four times a day (QID) | RESPIRATORY_TRACT | Status: DC | PRN

## 2021-07-19 MED ORDER — ACETAMINOPHEN 650 MG RE SUPP: 650.0000 mg | Freq: Four times a day (QID) | RECTAL | Status: DC | PRN

## 2021-07-19 MED ORDER — TACROLIMUS 1 MG PO CAPS: 1.0000 mg | ORAL_CAPSULE | Freq: Two times a day (BID) | ORAL | Status: DC

## 2021-07-19 MED ORDER — PANTOPRAZOLE SODIUM 40 MG PO TBEC
40.0000 mg | DELAYED_RELEASE_TABLET | Freq: Every day | ORAL | Status: DC
  Administered 2021-07-19 – 2021-07-21 (×3): 40 mg via ORAL
  Filled 2021-07-19 (×3): qty 1

## 2021-07-19 MED ORDER — AMLODIPINE BESYLATE 10 MG PO TABS
10.0000 mg | ORAL_TABLET | Freq: Every day | ORAL | Status: DC
  Administered 2021-07-19 – 2021-07-21 (×3): 10 mg via ORAL
  Filled 2021-07-19 (×3): qty 1

## 2021-07-19 MED ORDER — ENOXAPARIN SODIUM 40 MG/0.4ML IJ SOSY
40.0000 mg | PREFILLED_SYRINGE | INTRAMUSCULAR | Status: DC
  Administered 2021-07-19 – 2021-07-20 (×2): 40 mg via SUBCUTANEOUS
  Filled 2021-07-19 (×2): qty 0.4

## 2021-07-19 MED ORDER — TACROLIMUS 1 MG PO CAPS
1.0000 mg | ORAL_CAPSULE | Freq: Two times a day (BID) | ORAL | Status: DC
  Administered 2021-07-19: 1 mg via ORAL
  Filled 2021-07-19: qty 1

## 2021-07-19 MED ORDER — HYDROMORPHONE HCL 2 MG PO TABS
2.0000 mg | ORAL_TABLET | Freq: Once | ORAL | Status: AC
  Administered 2021-07-19: 2 mg via ORAL
  Filled 2021-07-19: qty 1

## 2021-07-19 MED ORDER — MYCOPHENOLATE SODIUM 180 MG PO TBEC: 720.0000 mg | DELAYED_RELEASE_TABLET | Freq: Two times a day (BID) | ORAL | Status: DC

## 2021-07-19 MED ORDER — ATORVASTATIN CALCIUM 10 MG PO TABS
10.0000 mg | ORAL_TABLET | Freq: Every day | ORAL | Status: DC
  Administered 2021-07-19 – 2021-07-21 (×3): 10 mg via ORAL
  Filled 2021-07-19 (×3): qty 1

## 2021-07-19 MED ORDER — CARVEDILOL 12.5 MG PO TABS
12.5000 mg | ORAL_TABLET | Freq: Two times a day (BID) | ORAL | Status: DC
  Administered 2021-07-19 – 2021-07-21 (×4): 12.5 mg via ORAL
  Filled 2021-07-19 (×6): qty 1

## 2021-07-19 MED ORDER — DIPHENHYDRAMINE HCL 25 MG PO CAPS
50.0000 mg | ORAL_CAPSULE | Freq: Once | ORAL | Status: AC
  Administered 2021-07-20: 50 mg via ORAL
  Filled 2021-07-19: qty 2

## 2021-07-19 MED ORDER — MAGNESIUM OXIDE -MG SUPPLEMENT 400 (240 MG) MG PO TABS
400.0000 mg | ORAL_TABLET | Freq: Two times a day (BID) | ORAL | Status: DC
  Administered 2021-07-19 – 2021-07-21 (×5): 400 mg via ORAL
  Filled 2021-07-19 (×5): qty 1

## 2021-07-19 MED ORDER — TACROLIMUS ER 4 MG PO TB24
12.0000 mg | ORAL_TABLET | Freq: Every day | ORAL | Status: DC
Start: 2021-07-19 — End: 2021-07-21
  Administered 2021-07-20: 12 mg via ORAL
  Filled 2021-07-19 (×3): qty 3

## 2021-07-19 NOTE — H&P (Signed)
History and Physical    Nicholas Green DOB: [birth year] DOA: 07/18/2021  Referring MD/NP/PA: Ileene Musa, MD PCP: Benito Mccreedy, MD  Patient coming from: Transfer from [facility]  Chief Complaint: Right arm pain and swelling  I have personally briefly reviewed patient's old medical records in [place]   HPI: Nicholas Green is a 56 y.o. male with medical history significant of hypertension, ESRD s/p renal transplant in 2021, SVT, and chronic back pain who presented for worsening pain and swelling of his right arm over the last 2 days.  Following his renal transplant he reported swelling in his right arm.  However symptoms acutely worsened to the point which he was unable to bend his arm or close his hand.  He complains of having severe pain due to the swelling.  He had been seen at [facility] 2 days and given Percocet 10 mg, but states that it provided no relief.  He has not had any recent injury or trauma to the arm to onset symptoms to his knowledge.  He denies having any recent fever, chills, shortness of breath, cough, nausea, vomiting, diarrhea, or dysuria.  He has been taking his antirejection medications as prescribed.  ED Course: On admission to the emergency department yesterday patient was noted to be afebrile, pulse 55-96, blood pressures elevated up to 196/142, and O2 saturations maintained on room air.  Labs from 1/30 noted WBC 5.9, platelets 145, BUN 19, creatinine 1.35.  Ultrasound of the arm was obtained showing occlusion of what was suspected to be the right innominate/subclavian vein stents.  Interventional radiology has been consulted and recommended CT venogram, but patient had contrast allergy and IV access had not been able to be obtained.  He has been given oral Dilaudid due to this reason.  Review of Systems  Musculoskeletal:  Positive for back pain and myalgias.  All other systems reviewed and are negative.  Past Medical History:  Diagnosis  Date   Chronic renal failure    S/P renal transplant   Condyloma    meatal condyloma    Dialysis patient [facility])    reports he gets dialysis every evening ; has graft in left upper extremity, shunt in right forearm and also has peritoneal access   Gout    Hypertension    Reflux    S/P kidney transplant    Stroke [facility])    SVT (supraventricular tachycardia) ([place]Pe[place])    SEE ED VISIT EPIC CARE EVERYWHERE 03-06-18 ; PATIENT HAD LOW PAOTASSIUM LEVELS, CARDIO CONSULT DONE AND UNDERWENT STRESS TEST AND ECHO . DENIES RECURENCE OF SX AT TODAY PRE-OP 04-29-18     Past Surgical History:  Procedure Laterality Date   AV FISTULA PLACEMENT     left arm   CYSTOSCOPY WITH BIOPSY N/A 05/07/2018   Procedure: CYSTOSCOPY WITH EXCISIONAL BIOPSY OF PENILE CONDYLOMA;  Surgeon: Irine Seal, MD;  Location: [facility];  Service: Urology;  Laterality: N/A;   HERNIA REPAIR     KIDNEY TRANSPLANT Right 2008   NASAL FRACTURE SURGERY       reports that he has quit smoking. His smoking use included cigarettes. He has never used smokeless tobacco. He reports that he does not drink alcohol and does not use drugs.  Allergies  Allergen Reactions   Ivp Dye [Iodinated Contrast Media] Swelling     Prior to Admission medications   Medication Sig Start Date End Date Taking? Authorizing Provider  amLODipine (NORVASC) 10 MG tablet Take 10 mg by mouth  daily.   Yes [provider]  aspirin 81 MG chewable tablet Chew by mouth daily.   Yes [provider]  atorvastatin (LIPITOR) 10 MG tablet Take 10 mg by mouth daily.   Yes [provider]  carvedilol (COREG) 12.5 MG tablet Take 12.5 mg by mouth 2 (two) times daily with a meal.   Yes [provider]  gabapentin (NEURONTIN) 300 MG capsule Take 300 mg by mouth 3 (three) times daily.   Yes [provider]  magnesium oxide (MAG-OX) 400 MG tablet Take 400 mg by mouth 2 (two) times daily. 2 tabs oral twice a day   Yes [provider]   mycophenolate (MYFORTIC) 180 MG EC tablet Take 720 mg by mouth 2 (two) times daily.   Yes [provider]  omeprazole (PRILOSEC) 40 MG capsule Take 40 mg by mouth daily.   Yes [provider]  oxycodone (ROXICODONE) 30 MG immediate release tablet Take 10 mg by mouth every 4 (four) hours as needed for pain.   Yes [provider]  predniSONE (DELTASONE) 5 MG tablet Take 5 mg by mouth daily with breakfast.   Yes [provider]  tacrolimus (PROGRAF) 1 MG capsule Take 1 mg by mouth 1 day or 1 dose.   Yes [provider]  tacrolimus (PROGRAF) 5 MG capsule Take 12 mg by mouth 1 day or 1 dose.   Yes [provider]  Vitamin D, Ergocalciferol, (DRISDOL) 1.25 MG (50000 UNIT) CAPS capsule Take 50,000 Units by mouth every 30 (thirty) days.   Yes [provider]  brimonidine-timolol (COMBIGAN) 0.2-0.5 % ophthalmic solution Place 1 drop into both eyes every 12 (twelve) hours.    [provider]  calcitRIOL (ROCALTROL) 0.25 MCG capsule Take 0.25 mcg by mouth daily.    [provider]  calcium carbonate (TUMS - DOSED IN MG ELEMENTAL CALCIUM) 500 MG chewable tablet Chew 1-2 tablets by mouth 3 (three) times daily as needed for indigestion or heartburn.    [provider]  cinacalcet (SENSIPAR) 60 MG tablet Take 60 mg by mouth daily.    [provider]  cloNIDine (CATAPRES - DOSED IN MG/24 HR) 0.3 mg/24hr patch Place 0.3 mg onto the skin every Sunday.    [provider]  ferric citrate (AURYXIA) 1 GM 210 MG(Fe) tablet Take 420-840 mg by mouth See admin instructions. Take 2 tablets (420 mg) by mouth with each snack & take 4 tablets (840 mg) by mouth with each meal    [provider]  hydrALAZINE (APRESOLINE) 100 MG tablet Take 100 mg by mouth 3 (three) times daily. 04/10/18   [provider]  nitroGLYCERIN (NITROSTAT) 0.4 MG SL tablet Place 0.4 mg under the tongue every 5 (five) minutes as  needed for chest pain.     [provider]  potassium chloride SA (K-DUR,KLOR-CON) 20 MEQ tablet Take 20 mEq by mouth daily.    [provider]    Physical Exam:  Constitutional: Middle-age male who appears to be in no acute distress at this time Vitals:   07/19/21 0600 07/19/21 0630 07/19/21 0853 07/19/21 0900  BP: (!) 166/103 (!) 152/96 (!) 153/101 (!) 168/89  Pulse: 61 (!) 57 60 (!) 53  Resp:  18  18  Temp:      TempSrc:      SpO2: 100% 95%  99%  Weight:      Height:       Eyes: PERRL, lids and conjunctivae normal ENMT:  Mucous membranes are moist. Posterior pharynx clear of any exudate or lesions.  Neck: normal, supple, no masses, no thyromegaly Respiratory: clear to auscultation bilaterally, no wheezing, no crackles. Normal respiratory effort.   Cardiovascular: Regular rate and rhythm, no murmurs / rubs / gallops.  Significant swelling of the right upper extremity 3 times the size of the left.  AV fistula present on the left upper extremity without palpable thrill.  No lower extremity edema appreciated. Abdomen: no tenderness, no masses palpated. No hepatosplenomegaly. Bowel sounds positive.  Musculoskeletal: no clubbing / cyanosis.  Diffuse swelling noted of the right upper extremity decreased mobility fingers and wrist due to pain. Skin: no rashes, lesions, ulcers. No induration Neurologic: CN 2-12 grossly intact. Sensation intact, DTR normal. Strength 5/5 in all 4.  Psychiatric: Normal judgment and insight. Alert and oriented x 3. Normal mood.     Labs on Admission: I have personally reviewed following labs and imaging studies  CBC: Recent Labs  Lab 07/18/21 1744  WBC 5.9  NEUTROABS 5.0  HGB 14.2  HCT 43.3  MCV 91.5  PLT 145*   Basic Metabolic Panel: Recent Labs  Lab 07/18/21 1744  NA 136  K 3.8  CL 104  CO2 23  GLUCOSE 99  BUN 19  CREATININE 1.35*  CALCIUM 9.1   GFR: Estimated Creatinine Clearance: 75.9 mL/min (A) (by C-G formula  based on SCr of 1.35 mg/dL (H)). Liver Function Tests: No results for input(s): AST, ALT, ALKPHOS, BILITOT, PROT, ALBUMIN in the last 168 hours. No results for input(s): LIPASE, AMYLASE in the last 168 hours. No results for input(s): AMMONIA in the last 168 hours. Coagulation Profile: No results for input(s): INR, PROTIME in the last 168 hours. Cardiac Enzymes: No results for input(s): CKTOTAL, CKMB, CKMBINDEX, TROPONINI in the last 168 hours. BNP (last 3 results) No results for input(s): PROBNP in the last 8760 hours. HbA1C: No results for input(s): HGBA1C in the last 72 hours. CBG: No results for input(s): GLUCAP in the last 168 hours. Lipid Profile: No results for input(s): CHOL, HDL, LDLCALC, TRIG, CHOLHDL, LDLDIRECT in the last 72 hours. Thyroid Function Tests: No results for input(s): TSH, T4TOTAL, FREET4, T3FREE, THYROIDAB in the last 72 hours. Anemia Panel: No results for input(s): VITAMINB12, FOLATE, FERRITIN, TIBC, IRON, RETICCTPCT in the last 72 hours. Urine analysis:    Component Value Date/Time   COLORURINE YELLOW 06/08/2020 0006   APPEARANCEUR CLEAR 06/08/2020 0006   LABSPEC 1.015 06/08/2020 0006   PHURINE 7.0 06/08/2020 0006   GLUCOSEU NEGATIVE 06/08/2020 0006   HGBUR NEGATIVE 06/08/2020 0006   BILIRUBINUR NEGATIVE 06/08/2020 0006   KETONESUR NEGATIVE 06/08/2020 0006   PROTEINUR NEGATIVE 06/08/2020 0006   UROBILINOGEN 0.2 06/01/2010 1050   NITRITE NEGATIVE 06/08/2020 0006   LEUKOCYTESUR NEGATIVE 06/08/2020 0006   Sepsis Labs: Recent Results (from the past 240 hour(s))  Resp Panel by RT-PCR (Flu A&B, Covid) Nasopharyngeal Swab     Status: None   Collection Time: 07/18/21  5:29 PM   Specimen: Nasopharyngeal Swab; Nasopharyngeal(NP) swabs in vial transport medium  Result Value Ref Range Status   SARS Coronavirus 2 by RT PCR NEGATIVE NEGATIVE Final    Comment: (NOTE) SARS-CoV-2 target nucleic acids are NOT DETECTED.  The SARS-CoV-2 RNA is generally detectable  in upper respiratory specimens during the acute phase of infection. The lowest concentration of SARS-CoV-2 viral copies this assay can detect is 138 copies/mL. A negative result does not preclude SARS-Cov-2 infection and should not be used as the sole  basis for treatment or other patient management decisions. A negative result may occur with  improper specimen collection/handling, submission of specimen other than nasopharyngeal swab, presence of viral mutation(s) within the areas targeted by this assay, and inadequate number of viral copies(<138 copies/mL). A negative result must be combined with clinical observations, patient history, and epidemiological information. The expected result is Negative.  Fact Sheet for Patients:  EntrepreneurPulse.com.au  Fact Sheet for Healthcare Providers:  IncredibleEmployment.be  This test is no t yet approved or cleared by the [place] FDA and  has been authorized for detection and/or diagnosis of SARS-CoV-2 by FDA under an Emergency Use Authorization (EUA). This EUA will remain  in effect (meaning this test can be used) for the duration of the COVID-19 declaration under Section 564(b)(1) of the Act, 21 U.S.C.section 360bbb-3(b)(1), unless the authorization is terminated  or revoked sooner.       Influenza A by PCR NEGATIVE NEGATIVE Final   Influenza B by PCR NEGATIVE NEGATIVE Final    Comment: (NOTE) The Xpert Xpress SARS-CoV-2/FLU/RSV plus assay is intended as an aid in the diagnosis of influenza from Nasopharyngeal swab specimens and should not be used as a sole basis for treatment. Nasal washings and aspirates are unacceptable for Xpert Xpress SARS-CoV-2/FLU/RSV testing.  Fact Sheet for Patients: EntrepreneurPulse.com.au  Fact Sheet for Healthcare Providers: IncredibleEmployment.be  This test is not yet approved or cleared by the [place] FDA and has  been authorized for detection and/or diagnosis of SARS-CoV-2 by FDA under an Emergency Use Authorization (EUA). This EUA will remain in effect (meaning this test can be used) for the duration of the COVID-19 declaration under Section 564(b)(1) of the Act, 21 U.S.C. section 360bbb-3(b)(1), unless the authorization is terminated or revoked.  Performed at [facility], [address] [place], [place] [address]      Radiological Exams on Admission: DG Wrist Complete Right  Result Date: 07/18/2021 CLINICAL DATA:  right arm swelling/pain EXAM: RIGHT WRIST - COMPLETE 3+ VIEW COMPARISON:  CT 06/07/2020 FINDINGS: There is no evidence of fracture or dislocation. No bony erosive change or focal osteopenia. Obliquity limits evaluation on the lateral radiograph. Mild first CMC and triscaphe degenerative change. Soft tissues edema. Small (3 mm) linear radiopaque foreign body within the volar and radial soft tissues at the level of the wrist. IMPRESSION: 1. Soft tissue edema without evidence of acute osseous abnormality. 2. Small (3 mm) linear radiopaque foreign body within the volar and radial soft tissues at the level of the wrist. 3. Mild first CMC and triscaphe degenerative change. Electronically Signed   By: Margaretha Sheffield M.D.   On: 07/18/2021 15:42   US Venous Img Upper Right (DVT Study)  Result Date: 07/18/2021 CLINICAL DATA:  56 year old male with right upper extremity swelling. EXAM: RIGHT UPPER EXTREMITY VENOUS DOPPLER ULTRASOUND TECHNIQUE: Gray-scale sonography with graded compression, as well as color Doppler and duplex ultrasound were performed to evaluate the upper extremity deep venous system from the level of the subclavian vein and including the jugular, axillary, basilic, radial, ulnar and upper cephalic vein. Spectral Doppler was utilized to evaluate flow at rest and with distal augmentation maneuvers. COMPARISON:  CT chest from 06/08/2020, CT head and neck from 07/07/2021  FINDINGS: Contralateral Subclavian Vein: Respiratory phasicity is normal and symmetric with the symptomatic side. No evidence of thrombus. Normal compressibility. Internal Jugular Vein: No evidence of thrombus. Normal compressibility, respiratory phasicity and response to augmentation. Subclavian Vein: No evidence of thrombus. Absence of respiratory  phasicity. Axillary Vein: No evidence of thrombus. Absence of respiratory phasicity. Cephalic Vein: No evidence of thrombus. Absence of respiratory phasicity. Normal compressibility. Basilic Vein: No evidence of thrombus. Absence of respiratory phasicity. Normal compressibility. Brachial Veins: No evidence of thrombus. Absence of respiratory phasicity. Normal compressibility. Radial Veins: No evidence of thrombus. Normal compressibility, respiratory phasicity and response to augmentation. Ulnar Veins: No evidence of thrombus. Normal compressibility, respiratory phasicity and response to augmentation. Other Findings:  Diffuse right upper extremity subcutaneous edema. IMPRESSION: No evidence of right upper extremity deep vein thrombosis. 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 Consider CT venogram chest for further characterization and referral to Interventional Radiology or other qualified endovascular specialist. These results were called by telephone at the time of interpretation on 07/18/2021 at 4:27 pm to provider Sherwood Gambler , who verbally acknowledged these results. Ruthann Cancer, MD [facility] Electronically Signed   By: Ruthann Cancer M.D.   On: 07/18/2021 16:27     Assessment/Plan  Subclavian vein occlusion: Acute.  Patient presents with complaints of progressively worsening right arm pain and swelling.  Doppler ultrasound of the upper extremities  noted concern for possible occlusion of the right innominate/subclavian vein.  IR have been formally consulted and plan on taking patient for procedure in a.m. -Admit to a MedSurg bed -N.p.o. after midnight for catheter -Hydromorphone p.o. as needed for pain -Appreciate IR consultative services, we will follow-up for any further recommendations   Status post renal transplant Chronic kidney disease stage IIIb: Creatinine appears near patient's baseline. -Continue Myfortic, Prograf, and prednisone  Thrombocytopenia: Chronic. Platelet count 145. -Continue to monitor  Diabetes mellitus type 2, well controlled: Glucose relatively within normal limits on admission.  Last available hemoglobin A1c was 6.5 and 06/2020.  He is not on any medications for treatment. -Carb modified diet -Recheck hemoglobin A1c in a.m.  Essential hypertension: Blood pressures were initially uncontrolled until resuming home medication regimen.  Home medication regimen includes Norvasc 10 mg daily and Coreg 12.5 mg twice daily. -Continue current regimen as tolerated  Chronic back pain -Continue gabapentin  DVT prophylaxis: lovenox Code Status: Full Family Communication: Patient's pastor updated at bedside Disposition Plan: Likely discharge home once medically stable Consults called: Interventional radiology Admission status: Requiring more than 2 midnight stay for need of procedure  Norval Morton MD [facility]   If 7PM-7AM, please contact night-coverage   07/19/2021, 10:35 AM

## 2021-07-19 NOTE — ED Notes (Signed)
[facility] at bedside 

## 2021-07-19 NOTE — ED Provider Notes (Signed)
Patient assessed this morning, as he remains boarding in the ED awaiting a bed at [facility].  We are advised by bed flow that a bed is now available, and he is awaiting transportation with [facility].  My reevaluation his pain is returning and he is requested additional oral pain medications, 2 mg of oral Dilaudid ordered.  He does continue to have significant lymphedema of the entire right upper extremity.  I reviewed his medical records from last night including earlier ED provider notes.  Unfortunately, the patient has had a uniquely difficult situation with IV access, as he has enormous lymphedema of the right extremity that is not amenable to an IV, and has a dialysis fistula of the left upper extremity.  I am hesitant to place a central line in the neck with concern for dislodging a central occlusion.  He is requiring upper extremity IV access for CT venogram.  In lieu of this fact, I have initiated the patient on oral prep therapy for his iodine contrast.  He will require 3 rounds of prednisone as well as Benadryl, which have been ordered.  I spoke to Dr Miles Costain from IR about the access issues, and he reports they will evaluate the patient on arrival at [facility], as the venous occlusion appears to be chronic from medical chart review.   Hospitalist team also made aware.  Home medications ordered - including prograf and tacrolimus, which will need to come from [facility] pharmacy.   Terald Sleeper, MD 07/19/21 [phone]

## 2021-07-19 NOTE — ED Notes (Signed)
Pt remains without IV access, due to R arm swelling and existing L arm dialysis fistula. Pt awaiting bed for admission. This RN asked EDP to verify necessity of IV with our limited access options, and EDP states that since pt is stable at this time, there is no need for IV access. Also not a candidate for foot IV access w/DM hx.

## 2021-07-19 NOTE — ED Notes (Signed)
ED TO INPATIENT HANDOFF REPORT  ED Nurse Name and Phone #: Baxter Flattery, RN  S Name/Age/Gender Nicholas Green 56 y.o. male Room/Bed: MH01/MH01  Code Status   Code Status: Not on file  Home/SNF/Other Home Patient oriented to: self, place, time, and situation Is this baseline? 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[place]) [id number]  Triage Note Pt c/o increase pain to right UE and decreased movement x 1 week-states sx worse after he was admitted to [facility] for "mimi stroke" last week-NAD-steady gait-reports BP to be taken left ankle   Allergies Allergies  Allergen Reactions   Ivp Dye [Iodinated Contrast Media] Swelling    Level of Care/Admitting Diagnosis ED Disposition     ED Disposition  Admit   Condition  --   [place]: [place] [100100]  Level of Care: Med-Surg [16]  May admit patient to [facility] or [facility] if equivalent level of care is available:: No  Interfacility transfer: Yes  Covid Evaluation: Asymptomatic Screening Protocol (No Symptoms)  Diagnosis: Subclavian vein occlusion [facility]) [id number]  Admitting Physician: Orene Desanctis [id number]  Attending Physician: Orene Desanctis [id number]  Estimated length of stay: past midnight tomorrow  Certification:: I certify this patient will need inpatient services for at least 2 midnights          B Medical/Surgery History Past Medical History:  Diagnosis Date   Chronic renal failure    S/P renal transplant   Condyloma    meatal condyloma    Dialysis patient [facility])    reports he gets dialysis every evening ; has graft in left upper extremity, shunt in right forearm and also has peritoneal access   Gout    Hypertension    Reflux    S/P kidney transplant    Stroke [facility])    SVT (supraventricular tachycardia) ([place])    SEE ED VISIT EPIC CARE EVERYWHERE 03-06-18 ; PATIENT HAD LOW PAOTASSIUM LEVELS, CARDIO CONSULT DONE AND UNDERWENT  STRESS TEST AND ECHO . DENIES RECURENCE OF SX AT TODAY PRE-OP 04-29-18    Past Surgical History:  Procedure Laterality Date   AV FISTULA PLACEMENT     left arm   CYSTOSCOPY WITH BIOPSY N/A 05/07/2018   Procedure: CYSTOSCOPY WITH EXCISIONAL BIOPSY OF PENILE CONDYLOMA;  Surgeon: Irine Seal, MD;  Location: [facility];  Service: Urology;  Laterality: N/A;   HERNIA REPAIR     KIDNEY TRANSPLANT Right 2008   NASAL FRACTURE SURGERY       A IV Location/Drains/Wounds Patient Lines/Drains/Airways Status     Active Line/Drains/Airways     None            Intake/Output Last 24 hours  Intake/Output Summary (Last 24 hours) at 07/19/2021 0749 Last data filed at 07/19/2021 0602 Gross per 24 hour  Intake --  Output 450 ml  Net -450 ml    Labs/Imaging Results for orders placed or performed during the hospital encounter of 07/18/21 (from the past 48 hour(s))  Resp Panel by RT-PCR (Flu A&B, Covid) Nasopharyngeal Swab     Status: None   Collection Time: 07/18/21  5:29 PM   Specimen: Nasopharyngeal Swab; Nasopharyngeal(NP) swabs in vial transport medium  Result Value Ref Range   SARS Coronavirus 2 by RT PCR NEGATIVE NEGATIVE    Comment: (NOTE) SARS-CoV-2 target nucleic acids are NOT DETECTED.  The SARS-CoV-2 RNA is generally detectable in upper respiratory specimens during the acute phase of infection. The  lowest concentration of SARS-CoV-2 viral copies this assay can detect is 138 copies/mL. A negative result does not preclude SARS-Cov-2 infection and should not be used as the sole basis for treatment or other patient management decisions. A negative result may occur with  improper specimen collection/handling, submission of specimen other than nasopharyngeal swab, presence of viral mutation(s) within the areas targeted by this assay, and inadequate number of viral copies(<138 copies/mL). A negative result must be combined with clinical observations, patient history, and  epidemiological information. The expected result is Negative.  Fact Sheet for Patients:  EntrepreneurPulse.com.au  Fact Sheet for Healthcare Providers:  IncredibleEmployment.be  This test is no t yet approved or cleared by the [place] FDA and  has been authorized for detection and/or diagnosis of SARS-CoV-2 by FDA under an Emergency Use Authorization (EUA). This EUA will remain  in effect (meaning this test can be used) for the duration of the COVID-19 declaration under Section 564(b)(1) of the Act, 21 U.S.C.section 360bbb-3(b)(1), unless the authorization is terminated  or revoked sooner.       Influenza A by PCR NEGATIVE NEGATIVE   Influenza B by PCR NEGATIVE NEGATIVE    Comment: (NOTE) The Xpert Xpress SARS-CoV-2/FLU/RSV plus assay is intended as an aid in the diagnosis of influenza from Nasopharyngeal swab specimens and should not be used as a sole basis for treatment. Nasal washings and aspirates are unacceptable for Xpert Xpress SARS-CoV-2/FLU/RSV testing.  Fact Sheet for Patients: EntrepreneurPulse.com.au  Fact Sheet for Healthcare Providers: IncredibleEmployment.be  This test is not yet approved or cleared by the [place] FDA and has been authorized for detection and/or diagnosis of SARS-CoV-2 by FDA under an Emergency Use Authorization (EUA). This EUA will remain in effect (meaning this test can be used) for the duration of the COVID-19 declaration under Section 564(b)(1) of the Act, 21 U.S.C. section 360bbb-3(b)(1), unless the authorization is terminated or revoked.  Performed at [facility], [address] [place], [place] [id number]   Basic metabolic panel     Status: Abnormal   Collection Time: 07/18/21  5:44 PM  Result Value Ref Range   Sodium 136 135 - 145 mmol/L   Potassium 3.8 3.5 - 5.1 mmol/L   Chloride 104 98 - 111 mmol/L   CO2 23 22 - 32 mmol/L    Glucose, Bld 99 70 - 99 mg/dL    Comment: Glucose reference range applies only to samples taken after fasting for at least 8 hours.   BUN 19 6 - 20 mg/dL   Creatinine, Ser 1.35 (H) 0.61 - 1.24 mg/dL   Calcium 9.1 8.9 - 10.3 mg/dL   GFR, Estimated >60 >60 mL/min    Comment: (NOTE) Calculated using the CKD-EPI Creatinine Equation (2021)    Anion gap 9 5 - 15    Comment: Performed at [facility], [address] [place], [place] [address]  CBC with Differential     Status: Abnormal   Collection Time: 07/18/21  5:44 PM  Result Value Ref Range   WBC 5.9 4.0 - 10.5 K/uL   RBC 4.73 4.22 - 5.81 MIL/uL   Hemoglobin 14.2 13.0 - 17.0 g/dL   HCT 43.3 39.0 - 52.0 %   MCV 91.5 80.0 - 100.0 fL   MCH 30.0 26.0 - 34.0 pg   MCHC 32.8 30.0 - 36.0 g/dL   RDW 14.2 11.5 - 15.5 %   Platelets 145 (L) 150 - 400 K/uL   nRBC 0.0 0.0 -  0.2 %   Neutrophils Relative % 83 %   Neutro Abs 5.0 1.7 - 7.7 K/uL   Lymphocytes Relative 9 %   Lymphs Abs 0.5 (L) 0.7 - 4.0 K/uL   Monocytes Relative 7 %   Monocytes Absolute 0.4 0.1 - 1.0 K/uL   Eosinophils Relative 0 %   Eosinophils Absolute 0.0 0.0 - 0.5 K/uL   Basophils Relative 0 %   Basophils Absolute 0.0 0.0 - 0.1 K/uL   Immature Granulocytes 1 %   Abs Immature Granulocytes 0.03 0.00 - 0.07 K/uL    Comment: Performed at [facility], [address] [place], [place] [address]   DG Wrist Complete Right  Result Date: 07/18/2021 CLINICAL DATA:  right arm swelling/pain EXAM: RIGHT WRIST - COMPLETE 3+ VIEW COMPARISON:  CT 06/07/2020 FINDINGS: There is no evidence of fracture or dislocation. No bony erosive change or focal osteopenia. Obliquity limits evaluation on the lateral radiograph. Mild first CMC and triscaphe degenerative change. Soft tissues edema. Small (3 mm) linear radiopaque foreign body within the volar and radial soft tissues at the level of the wrist. IMPRESSION: 1. Soft tissue edema without evidence of acute osseous abnormality.  2. Small (3 mm) linear radiopaque foreign body within the volar and radial soft tissues at the level of the wrist. 3. Mild first CMC and triscaphe degenerative change. Electronically Signed   By: Margaretha Sheffield M.D.   On: 07/18/2021 15:42   US Venous Img Upper Right (DVT Study)  Result Date: 07/18/2021 CLINICAL DATA:  56 year old male with right upper extremity swelling. EXAM: RIGHT UPPER EXTREMITY VENOUS DOPPLER ULTRASOUND TECHNIQUE: Gray-scale sonography with graded compression, as well as color Doppler and duplex ultrasound were performed to evaluate the upper extremity deep venous system from the level of the subclavian vein and including the jugular, axillary, basilic, radial, ulnar and upper cephalic vein. Spectral Doppler was utilized to evaluate flow at rest and with distal augmentation maneuvers. COMPARISON:  CT chest from 06/08/2020, CT head and neck from 07/07/2021 FINDINGS: Contralateral Subclavian Vein: Respiratory phasicity is normal and symmetric with the symptomatic side. No evidence of thrombus. Normal compressibility. Internal Jugular Vein: No evidence of thrombus. Normal compressibility, respiratory phasicity and response to augmentation. Subclavian Vein: No evidence of thrombus. Absence of respiratory phasicity. Axillary Vein: No evidence of thrombus. Absence of respiratory phasicity. Cephalic Vein: No evidence of thrombus. Absence of respiratory phasicity. Normal compressibility. Basilic Vein: No evidence of thrombus. Absence of respiratory phasicity. Normal compressibility. Brachial Veins: No evidence of thrombus. Absence of respiratory phasicity. Normal compressibility. Radial Veins: No evidence of thrombus. Normal compressibility, respiratory phasicity and response to augmentation. Ulnar Veins: No evidence of thrombus. Normal compressibility, respiratory phasicity and response to augmentation. Other Findings:  Diffuse right upper extremity subcutaneous edema. IMPRESSION: No evidence of  right upper extremity deep vein thrombosis. 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 Consider CT venogram chest for further characterization and referral to Interventional Radiology or other qualified endovascular specialist. These results were called by telephone at the time of interpretation on 07/18/2021 at 4:27 pm to provider Sherwood Gambler , who verbally acknowledged these results. Ruthann Cancer, MD [facility] Radiology Electronically Signed   By: Ruthann Cancer M.D.   On: 07/18/2021 16:27    Pending Labs Unresulted Labs (From admission, onward)    None       Vitals/Pain Today's  Vitals   07/19/21 0430 07/19/21 0600 07/19/21 0630 07/19/21 0712  BP: (!) 137/99 (!) 166/103 (!) 152/96   Pulse: (!) 55 61 (!) 57   Resp:   18   Temp:      TempSrc:      SpO2: 96% 100% 95%   Weight:      Height:      PainSc:    7     Isolation Precautions No active isolations  Medications Medications  HYDROmorphone (DILAUDID) tablet 2 mg (has no administration in time range)  amLODipine (NORVASC) tablet 10 mg (has no administration in time range)  aspirin chewable tablet 81 mg (has no administration in time range)  atorvastatin (LIPITOR) tablet 10 mg (has no administration in time range)  carvedilol (COREG) tablet 12.5 mg (has no administration in time range)  gabapentin (NEURONTIN) capsule 300 mg (has no administration in time range)  magnesium oxide (MAG-OX) tablet 400 mg (has no administration in time range)  mycophenolate (MYFORTIC) EC tablet 720 mg (has no administration in time range)  pantoprazole (PROTONIX) EC tablet 40 mg (has no administration in time range)  predniSONE (DELTASONE) tablet 5 mg (has no administration in time range)  tacrolimus (PROGRAF) capsule 1 mg (has no administration in  time range)  HYDROmorphone (DILAUDID) injection 2 mg (2 mg Intramuscular Given 07/18/21 1544)    Mobility walks Low fall risk   Focused Assessments Cardiac Assessment Handoff:    Lab Results  Component Value Date   CKTOTAL 60 06/07/2020   No results found for: DDIMER Does the Patient currently have chest pain? No    R Recommendations: See Admitting Provider Note  Report given to:   Additional Notes:

## 2021-07-19 NOTE — Consult Note (Addendum)
Chief Complaint: Patient was seen in consultation today for right UE and central venous venogram with possible intervention  Chief Complaint  Patient presents with   Arm Pain   at the request of Criss Alvine, S.   Referring Physician(s): Criss Alvine, S.   Supervising Physician: Marliss Coots  Patient Status: [facility] - In-pt  History of Present Illness: Nicholas Green is a 56 y.o. male with PMHs of HTN, SVT, hx ESRD on RRT s/p left UE AVG creation in 2017, not on RRT after renal transplant in 2022, chronic right arm lymphedema who presented to ED on 07/18/21 due to right arm pain and swelling.  Patient underwent US doppler of the right UE which showed:   No evidence of right upper extremity deep vein thrombosis. 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  The result was discussed with Dr. Criss Alvine EDP by Dr. Denton Brick, recommendation was made to admit the patient for possible venogram followed by intervention for possible central venous occlusion.   Patient laying in bed, not in acute distress.  Reports that his right has been somewhat swollen ever since kidney transplant, but it got to the point when he could not move right wrist due to swelling and pain last week. Patient states that he has not been on dialysis since the kidney transplant, and the left arm fistula/graft has not been used since the transplant as well.    Past Medical History:  Diagnosis Date   Chronic renal failure    S/P renal transplant   Condyloma    meatal condyloma    Dialysis patient [facility])    reports he gets dialysis every evening ; has graft in left upper extremity, shunt in right forearm and also has peritoneal access   Gout    Hypertension    Reflux    S/P kidney transplant    Stroke [facility])    SVT (supraventricular tachycardia) (HCC)    SEE ED VISIT EPIC  CARE EVERYWHERE 03-06-18 ; PATIENT HAD LOW PAOTASSIUM LEVELS, CARDIO CONSULT DONE AND UNDERWENT STRESS TEST AND ECHO . DENIES RECURENCE OF SX AT TODAY PRE-OP 04-29-18     Past Surgical History:  Procedure Laterality Date   AV FISTULA PLACEMENT     left arm   CYSTOSCOPY WITH BIOPSY N/A 05/07/2018   Procedure: CYSTOSCOPY WITH EXCISIONAL BIOPSY OF PENILE CONDYLOMA;  Surgeon: Bjorn Pippin, MD;  Location: [facility];  Service: Urology;  Laterality: N/A;   HERNIA REPAIR     KIDNEY TRANSPLANT Right 2008   NASAL FRACTURE SURGERY      Allergies: Ivp dye [iodinated contrast media] and Metrizamide  Medications: Prior to Admission medications   Medication Sig Start Date End Date Taking? Authorizing Provider  amLODipine (NORVASC) 10 MG tablet Take 10 mg by mouth daily.   Yes [provider]  aspirin 81 MG chewable tablet Chew by mouth daily.   Yes [provider]  atorvastatin (LIPITOR) 10 MG tablet Take 10 mg by mouth daily.   Yes [provider]  carvedilol (COREG) 12.5 MG tablet Take 12.5 mg by mouth 2 (two) times daily with a meal.   Yes [provider]  gabapentin (NEURONTIN) 300 MG capsule Take 300 mg by mouth 3 (three) times daily.   Yes [provider]  magnesium oxide (MAG-OX) 400 MG tablet Take 400 mg by mouth 2 (two) times daily. 2 tabs oral twice a day  Yes [provider]  mycophenolate (MYFORTIC) 180 MG EC tablet Take 720 mg by mouth 2 (two) times daily.   Yes [provider]  omeprazole (PRILOSEC) 40 MG capsule Take 40 mg by mouth daily.   Yes [provider]  oxycodone (ROXICODONE) 30 MG immediate release tablet Take 10 mg by mouth every 4 (four) hours as needed for pain.   Yes [provider]  predniSONE (DELTASONE) 5 MG tablet Take 5 mg by mouth daily with breakfast.   Yes [provider]  tacrolimus (PROGRAF) 1 MG capsule Take 1 mg by mouth 1 day or 1 dose.   Yes [provider]   tacrolimus (PROGRAF) 5 MG capsule Take 12 mg by mouth 1 day or 1 dose.   Yes [provider]  Vitamin D, Ergocalciferol, (DRISDOL) 1.25 MG (50000 UNIT) CAPS capsule Take 50,000 Units by mouth every 30 (thirty) days.   Yes [provider]  brimonidine-timolol (COMBIGAN) 0.2-0.5 % ophthalmic solution Place 1 drop into both eyes every 12 (twelve) hours.    [provider]  calcitRIOL (ROCALTROL) 0.25 MCG capsule Take 0.25 mcg by mouth daily.    [provider]  calcium carbonate (TUMS - DOSED IN MG ELEMENTAL CALCIUM) 500 MG chewable tablet Chew 1-2 tablets by mouth 3 (three) times daily as needed for indigestion or heartburn.    [provider]  cinacalcet (SENSIPAR) 60 MG tablet Take 60 mg by mouth daily.    [provider]  cloNIDine (CATAPRES - DOSED IN MG/24 HR) 0.3 mg/24hr patch Place 0.3 mg onto the skin every Sunday.    [provider]  ferric citrate (AURYXIA) 1 GM 210 MG(Fe) tablet Take 420-840 mg by mouth See admin instructions. Take 2 tablets (420 mg) by mouth with each snack & take 4 tablets (840 mg) by mouth with each meal    [provider]  hydrALAZINE (APRESOLINE) 100 MG tablet Take 100 mg by mouth 3 (three) times daily. 04/10/18   [provider]  nitroGLYCERIN (NITROSTAT) 0.4 MG SL tablet Place 0.4 mg under the tongue every 5 (five) minutes as needed for chest pain.     [provider]  potassium chloride SA (K-DUR,KLOR-CON) 20 MEQ tablet Take 20 mEq by mouth daily.    [provider]     History reviewed. No pertinent family history.  Social History   Socioeconomic History   Marital status: Legally Separated    Spouse name: Not on file   Number of children: Not on file   Years of education: Not on file   Highest education level: Not on file  Occupational History   Not on file  Tobacco Use   Smoking status: Former    Types: Cigarettes   Smokeless tobacco: Never  Vaping Use    Vaping Use: Never used  Substance and Sexual Activity   Alcohol use: No   Drug use: No   Sexual activity: Not on file  Other Topics Concern   Not on file  Social History Narrative   Not on file   Social Determinants of Health   Financial Resource Strain: Not on file  Food Insecurity: Not on file  Transportation Needs: Not on file  Physical Activity: Not on file  Stress: Not on file  Social Connections: Not on file     Review of Systems: A 12 point ROS discussed and pertinent positives are indicated in the HPI above.  All other systems are negative.  Vital Signs: BP Marland Kitchen)  165/97 (BP Location: Left Leg)    Pulse 65    Temp 98.2 F (36.8 C) (Oral)    Resp 17    Ht 6\' 1"  (1.854 m)    Wt 226 lb (102.5 kg)    SpO2 97%    BMI 29.82 kg/m    Physical Exam Vitals reviewed.  Constitutional:      General: He is not in acute distress.    Appearance: Normal appearance. He is not ill-appearing.  HENT:     Head: Normocephalic and atraumatic.     Mouth/Throat:     Mouth: Mucous membranes are moist.  Cardiovascular:     Rate and Rhythm: Normal rate and regular rhythm.     Heart sounds: Normal heart sounds.  Pulmonary:     Effort: Pulmonary effort is normal.     Breath sounds: Normal breath sounds.  Abdominal:     General: Abdomen is flat. Bowel sounds are normal.     Palpations: Abdomen is soft.  Musculoskeletal:        General: Swelling present.     Right lower leg: Edema present.     Comments: Significant swelling on entire right arm. No increased warmth, no TTP.   AVG on left forearm and upper arm, no bruit or thrill   Skin:    General: Skin is warm and dry.     Coloration: Skin is not jaundiced or pale.  Neurological:     Mental Status: He is alert and oriented to person, place, and time.  Psychiatric:        Mood and Affect: Mood normal.        Behavior: Behavior normal.        Judgment: Judgment normal.    MD Evaluation Airway: WNL Heart: WNL Abdomen:  WNL Chest/ Lungs: WNL ASA  Classification: 3 Mallampati/Airway Score: Two  Imaging: DG Wrist Complete Right  Result Date: 07/18/2021 CLINICAL DATA:  right arm swelling/pain EXAM: RIGHT WRIST - COMPLETE 3+ VIEW COMPARISON:  CT 06/07/2020 FINDINGS: There is no evidence of fracture or dislocation. No bony erosive change or focal osteopenia. Obliquity limits evaluation on the lateral radiograph. Mild first CMC and triscaphe degenerative change. Soft tissues edema. Small (3 mm) linear radiopaque foreign body within the volar and radial soft tissues at the level of the wrist. IMPRESSION: 1. Soft tissue edema without evidence of acute osseous abnormality. 2. Small (3 mm) linear radiopaque foreign body within the volar and radial soft tissues at the level of the wrist. 3. Mild first CMC and triscaphe degenerative change. Electronically Signed   By: Feliberto HartsFrederick S Jones M.D.   On: 07/18/2021 15:42   US Venous Img Upper Right (DVT Study)  Result Date: 07/18/2021 CLINICAL DATA:  56 year old male with right upper extremity swelling. EXAM: RIGHT UPPER EXTREMITY VENOUS DOPPLER ULTRASOUND TECHNIQUE: Gray-scale sonography with graded compression, as well as color Doppler and duplex ultrasound were performed to evaluate the upper extremity deep venous system from the level of the subclavian vein and including the jugular, axillary, basilic, radial, ulnar and upper cephalic vein. Spectral Doppler was utilized to evaluate flow at rest and with distal augmentation maneuvers. COMPARISON:  CT chest from 06/08/2020, CT head and neck from 07/07/2021 FINDINGS: Contralateral Subclavian Vein: Respiratory phasicity is normal and symmetric with the symptomatic side. No evidence of thrombus. Normal compressibility. Internal Jugular Vein: No evidence of thrombus. Normal compressibility, respiratory phasicity and response to augmentation. Subclavian Vein: No evidence of thrombus. Absence of respiratory phasicity. Axillary Vein: No  evidence of thrombus. Absence of respiratory phasicity. Cephalic Vein: No evidence of thrombus. Absence of respiratory phasicity. Normal compressibility. Basilic Vein: No evidence of thrombus. Absence of respiratory phasicity. Normal compressibility. Brachial Veins: No evidence of thrombus. Absence of respiratory phasicity. Normal compressibility. Radial Veins: No evidence of thrombus. Normal compressibility, respiratory phasicity and response to augmentation. Ulnar Veins: No evidence of thrombus. Normal compressibility, respiratory phasicity and response to augmentation. Other Findings:  Diffuse right upper extremity subcutaneous edema. IMPRESSION: No evidence of right upper extremity deep vein thrombosis. 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 Consider CT venogram chest for further characterization and referral to Interventional Radiology or other qualified endovascular specialist. These results were called by telephone at the time of interpretation on 07/18/2021 at 4:27 pm to provider Pricilla Loveless , who verbally acknowledged these results. Marliss Coots, MD [facility] Radiology Electronically Signed   By: Marliss Coots M.D.   On: 07/18/2021 16:27    Labs:  CBC: Recent Labs    07/18/21 1744  WBC 5.9  HGB 14.2  HCT 43.3  PLT 145*    COAGS: No results for input(s): INR, APTT in the last 8760 hours.  BMP: Recent Labs    07/18/21 1744 07/19/21 1209  NA 136 139  K 3.8 4.1  CL 104 108  CO2 23 23  GLUCOSE 99 116*  BUN 19 17  CALCIUM 9.1 9.4  CREATININE 1.35* 1.41*  GFRNONAA >60 58*    LIVER FUNCTION TESTS: Recent Labs    07/19/21 1209  ALBUMIN 3.8    TUMOR MARKERS: No results for input(s): AFPTM, CEA, CA199, CHROMGRNA in the last 8760 hours.  Assessment and Plan: 56 y.o. male  with uncontrolled right arm pain and swelling most likely due to central venous occlusion, who is in need of right upper extremity and central venous venogram with possible intervention.   Patent's case is complicated by lack of IV access due to right arm lymphedema and restricted left arm due to L UE AVG as well as patient allergic to contrast.  Discussed with Dr. Elby Showers, IR will attempt to obtain an venous access via either right UE or femoral vein to administer sedation and perform venogram. Patient will undergo 13 hr allergy prep.  The procedure is tentatively scheduled for tomorrow at 1PM.  CBC and RF stable On ASA and lovenox, no need for d/c per Dr. Elby Showers.  PLAN - NPO at MN  - 13 hour allergy prep starting at Wed MN   - PO Prednisone 50 mg at 0, 6, and 12 hrs   - PO Benadryl 50 mg at 12 hrs  Risks and benefits discussed with the patient including, but not limited to bleeding, infection, contrast induced renal failure, damage to adjacent structures including vessel damage which may require additional procedure or surgery, and not able to intervene.   All of the patient's questions were answered, patient is agreeable to proceed. Consent signed and in chart.  Thank you for this interesting consult.  I greatly enjoyed meeting MICHAIL BOYTE and look forward to participating in their care.  A copy of this report was sent to the requesting provider on this date.  Electronically Signed: Willette Brace, PA-C 07/19/2021, 2:04 PM   I spent a total of 40 Minutes    in face to face in clinical consultation, greater than 50% of which was  counseling/coordinating care for venogram with possible intervention for CV occlusion.   This chart was dictated using voice recognition software.  Despite best efforts to proofread,  errors can occur which can change the documentation meaning.

## 2021-07-20 ENCOUNTER — Inpatient Hospital Stay (HOSPITAL_COMMUNITY): Payer: Medicare Other

## 2021-07-20 DIAGNOSIS — I82B11 Acute embolism and thrombosis of right subclavian vein: Secondary | ICD-10-CM

## 2021-07-20 HISTORY — PX: IR VENOCAVAGRAM SVC: IMG679

## 2021-07-20 HISTORY — PX: IR US GUIDE VASC ACCESS RIGHT: IMG2390

## 2021-07-20 LAB — CBC
HCT: 44.4 % (ref 39.0–52.0)
Hemoglobin: 14.9 g/dL (ref 13.0–17.0)
MCH: 31 pg (ref 26.0–34.0)
MCHC: 33.6 g/dL (ref 30.0–36.0)
MCV: 92.5 fL (ref 80.0–100.0)
Platelets: 169 10*3/uL (ref 150–400)
RBC: 4.8 MIL/uL (ref 4.22–5.81)
RDW: 13.8 % (ref 11.5–15.5)
WBC: 6.2 10*3/uL (ref 4.0–10.5)
nRBC: 0 % (ref 0.0–0.2)

## 2021-07-20 LAB — RENAL FUNCTION PANEL
Albumin: 3.3 g/dL — ABNORMAL LOW (ref 3.5–5.0)
Anion gap: 10 (ref 5–15)
BUN: 25 mg/dL — ABNORMAL HIGH (ref 6–20)
CO2: 21 mmol/L — ABNORMAL LOW (ref 22–32)
Calcium: 9.5 mg/dL (ref 8.9–10.3)
Chloride: 105 mmol/L (ref 98–111)
Creatinine, Ser: 1.6 mg/dL — ABNORMAL HIGH (ref 0.61–1.24)
GFR, Estimated: 50 mL/min — ABNORMAL LOW (ref >60)
Glucose, Bld: 151 mg/dL — ABNORMAL HIGH (ref 70–99)
Phosphorus: 2.3 mg/dL — ABNORMAL LOW (ref 2.5–4.6)
Potassium: 4.5 mmol/L (ref 3.5–5.1)
Sodium: 136 mmol/L (ref 135–145)

## 2021-07-20 LAB — GLUCOSE, CAPILLARY: Glucose-Capillary: 171 mg/dL — ABNORMAL HIGH (ref 70–99)

## 2021-07-20 LAB — HEMOGLOBIN A1C
Hgb A1c MFr Bld: 5.3 % (ref 4.8–5.6)
Mean Plasma Glucose: 105.41 mg/dL

## 2021-07-20 MED ORDER — DIPHENHYDRAMINE HCL 50 MG/ML IJ SOLN
INTRAMUSCULAR | Status: AC | PRN
Start: 2021-07-20 — End: 2021-07-20
  Administered 2021-07-20: 50 mg via INTRAVENOUS

## 2021-07-20 MED ORDER — FENTANYL CITRATE (PF) 100 MCG/2ML IJ SOLN
INTRAMUSCULAR | Status: AC
  Filled 2021-07-20: qty 2

## 2021-07-20 MED ORDER — MIDAZOLAM HCL 2 MG/2ML IJ SOLN
INTRAMUSCULAR | Status: AC
  Filled 2021-07-20: qty 2

## 2021-07-20 MED ORDER — SENNA 8.6 MG PO TABS
2.0000 | ORAL_TABLET | Freq: Every day | ORAL | Status: DC
  Administered 2021-07-20: 17.2 mg via ORAL
  Filled 2021-07-20: qty 2

## 2021-07-20 MED ORDER — MIDAZOLAM HCL 2 MG/2ML IJ SOLN
INTRAMUSCULAR | Status: AC | PRN
  Administered 2021-07-20: .5 mg via INTRAVENOUS
  Administered 2021-07-20: 1 mg via INTRAVENOUS
  Administered 2021-07-20 (×2): .5 mg via INTRAVENOUS

## 2021-07-20 MED ORDER — CHLORHEXIDINE GLUCONATE CLOTH 2 % EX PADS
6.0000 | MEDICATED_PAD | Freq: Every day | CUTANEOUS | Status: DC
  Administered 2021-07-21: 6 via TOPICAL

## 2021-07-20 MED ORDER — INSULIN ASPART 100 UNIT/ML IJ SOLN: 0.0000 [IU] | Freq: Every day | INTRAMUSCULAR | Status: DC

## 2021-07-20 MED ORDER — INSULIN ASPART 100 UNIT/ML IJ SOLN
0.0000 [IU] | Freq: Three times a day (TID) | INTRAMUSCULAR | Status: DC
  Administered 2021-07-21: 1 [IU] via SUBCUTANEOUS

## 2021-07-20 MED ORDER — FENTANYL CITRATE (PF) 100 MCG/2ML IJ SOLN
INTRAMUSCULAR | Status: AC | PRN
  Administered 2021-07-20: 50 ug via INTRAVENOUS
  Administered 2021-07-20 (×3): 25 ug via INTRAVENOUS

## 2021-07-20 MED ORDER — LIDOCAINE HCL 1 % IJ SOLN
INTRAMUSCULAR | Status: AC
  Filled 2021-07-20: qty 20

## 2021-07-20 MED ORDER — IOHEXOL 300 MG/ML  SOLN
100.0000 mL | Freq: Once | INTRAMUSCULAR | Status: AC | PRN
  Administered 2021-07-20: 15 mL via INTRAVENOUS

## 2021-07-20 MED ORDER — DIPHENHYDRAMINE HCL 50 MG/ML IJ SOLN
INTRAMUSCULAR | Status: AC
  Filled 2021-07-20: qty 1

## 2021-07-20 NOTE — Progress Notes (Signed)
Patient questioning renal diet, states since his transplant his diet has been liberated by drs in [facility]. Message sent to attending, diet liberated to heart healthy

## 2021-07-20 NOTE — Progress Notes (Signed)
PROGRESS NOTE  Nicholas Green [id number] DOB: 08-12-1965 DOA: 07/18/2021 PCP: Benito Mccreedy, MD  HPI/Recap of past 24 hours: Nicholas Green is a 56 y.o. male with medical history significant of hypertension, ESRD s/p renal transplant in 2021, SVT, and chronic back pain who presented for worsening pain and swelling of his right arm over the last 2 days.  States he started noticing the swelling back in 2021 after his renal transplant.  However symptoms acutely worsened to the point where he was unable to bend his arm or make a fist.  He complains of having severe pain due to the swelling.  He had been seen at [facility] 2 days and given Percocet 10 mg, but states that it provided no relief.  Work-up revealed right venous thoracic outlet syndrome with possible malpositioned indwelling subclavian and innominate vein stents for which he was seen by IR Dr. Irene Pap on 07/19/2021.  07/20/2021: Patient was seen and examined at bedside.  Endorses tenderness with minimal palpation of his right upper extremity.    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  Endorses onset in 2021 after renal transplant.   Doppler ultrasound of the upper extremities noted concern for possible occlusion of the right innominate/subclavian vein.   Seen by IR, appreciate assistance. Continue pain control and bowel regimen.   Status post renal transplant Chronic kidney disease stage IIIb:  Creatinine appears near patient's baseline. -Continue Myfortic, Prograf, and prednisone   Resolved acute thrombocytopenia:  Platelet 169K 07/20/2021  Hyperglycemia secondary to chronic steroid  use Hemoglobin A1c 5.3 on 07/20/2021 Patient on prednisone prior to admission 5 mg daily.   Essential hypertension:  BP is not at goal, elevated.   Continue home oral antihypertensive.    Home medication regimen includes Norvasc 10 mg daily and Coreg 12.5 mg twice daily. Continue to closely monitor vital signs.  Chronic back pain Continue home regimen.   DVT prophylaxis: lovenox subcu daily Code Status: Full Family Communication: None at bedside. Disposition Plan: Likely discharge home once medically stable and IR has signed off. Consults called: Interventional radiology. Admission status: Requiring more than 2 midnight stay for need of procedure    Status is: Inpatient   Planned Discharge Destination: Home          Objective: Vitals:   07/20/21 0000 07/20/21 0656 07/20/21 0904 07/20/21 1237  BP: (!) 162/67 (!) 183/83 (!) 174/98 (!) 160/83  Pulse: 68 (!) 58    Resp: 16 16    Temp: 98.3 F (36.8 C) 97.6 F (36.4 C) 97.7 F (36.5 C) (!) 97.5 F (36.4 C)  TempSrc: Oral Oral Oral Oral  SpO2: 97% 95%    Weight:      Height:        Intake/Output Summary (Last 24 hours) at 07/20/2021 1346 Last data filed at 07/20/2021 0900 Gross per 24 hour  Intake 118 ml  Output 950 ml  Net -832 ml   Filed Weights   07/18/21 1250  Weight: 102.5 kg    Exam:  General: 56 y.o. year-old male well developed well nourished in no acute distress.  Alert and oriented x3. Cardiovascular: Regular rate and rhythm with no rubs or gallops.  No thyromegaly or  JVD noted.   Respiratory: Clear to auscultation with no wheezes or rales. Good inspiratory effort. Abdomen: Soft nontender nondistended with normal bowel sounds x4 quadrants. Musculoskeletal: Right upper extremity twice the size of the left upper extremity. Skin: No ulcerative lesions noted or rashes, Psychiatry: Mood is appropriate for condition and setting   Data Reviewed: CBC: Recent Labs  Lab 07/18/21 1744 07/20/21 0305   WBC 5.9 6.2  NEUTROABS 5.0  --   HGB 14.2 14.9  HCT 43.3 44.4  MCV 91.5 92.5  PLT 145* [id number]   Basic Metabolic Panel: Recent Labs  Lab 07/18/21 1744 07/19/21 1209 07/20/21 0305  NA 136 139 136  K 3.8 4.1 4.5  CL 104 108 105  CO2 23 23 21*  GLUCOSE 99 116* 151*  BUN 19 17 25*  CREATININE 1.35* 1.41* 1.60*  CALCIUM 9.1 9.4 9.5  PHOS  --  2.2* 2.3*   GFR: Estimated Creatinine Clearance: 64.1 mL/min (A) (by C-G formula based on SCr of 1.6 mg/dL (H)). Liver Function Tests: Recent Labs  Lab 07/19/21 1209 07/20/21 0305  ALBUMIN 3.8 3.3*   No results for input(s): LIPASE, AMYLASE in the last 168 hours. No results for input(s): AMMONIA in the last 168 hours. Coagulation Profile: No results for input(s): INR, PROTIME in the last 168 hours. Cardiac Enzymes: No results for input(s): CKTOTAL, CKMB, CKMBINDEX, TROPONINI in the last 168 hours. BNP (last 3 results) No results for input(s): PROBNP in the last 8760 hours. HbA1C: Recent Labs    07/20/21 0305  HGBA1C 5.3   CBG: No results for input(s): GLUCAP in the last 168 hours. Lipid Profile: No results for input(s): CHOL, HDL, LDLCALC, TRIG, CHOLHDL, LDLDIRECT in the last 72 hours. Thyroid Function Tests: No results for input(s): TSH, T4TOTAL, FREET4, T3FREE, THYROIDAB in the last 72 hours. Anemia Panel: No results for input(s): VITAMINB12, FOLATE, FERRITIN, TIBC, IRON, RETICCTPCT in the last 72 hours. Urine analysis:    Component Value Date/Time   COLORURINE YELLOW 06/08/2020 0006   APPEARANCEUR CLEAR 06/08/2020 0006   LABSPEC 1.015 06/08/2020 0006   PHURINE 7.0 06/08/2020 0006   GLUCOSEU NEGATIVE 06/08/2020 0006   HGBUR NEGATIVE 06/08/2020 0006   BILIRUBINUR NEGATIVE 06/08/2020 0006   KETONESUR NEGATIVE 06/08/2020 0006   PROTEINUR NEGATIVE 06/08/2020 0006   UROBILINOGEN 0.2 06/01/2010 1050   NITRITE NEGATIVE 06/08/2020 0006   LEUKOCYTESUR NEGATIVE 06/08/2020 0006   Sepsis  Labs: @LABRCNTIP (procalcitonin:4,lacticidven:4)  ) Recent Results (from the past 240 hour(s))  Resp Panel by RT-PCR (Flu A&B, Covid) Nasopharyngeal Swab     Status: None   Collection Time: 07/18/21  5:29 PM   Specimen: Nasopharyngeal Swab; Nasopharyngeal(NP) swabs in vial transport medium  Result Value Ref Range Status   SARS Coronavirus 2 by RT PCR NEGATIVE NEGATIVE Final    Comment: (NOTE) SARS-CoV-2 target nucleic acids are NOT DETECTED.  The SARS-CoV-2 RNA is generally detectable in upper respiratory specimens during the acute phase of infection. The lowest concentration of SARS-CoV-2 viral copies this assay can detect is 138 copies/mL. A negative result does not preclude SARS-Cov-2 infection and should not be used as the sole basis for treatment or other patient management decisions. A negative result may occur with  improper specimen collection/handling, submission of specimen other than nasopharyngeal swab, presence of viral mutation(s) within the areas targeted by this assay, and inadequate number of viral copies(<138 copies/mL). A negative result must be combined with clinical observations, patient history, and epidemiological information. The expected result is Negative.  Fact Sheet  for Patients:  EntrepreneurPulse.com.au  Fact Sheet for Healthcare Providers:  IncredibleEmployment.be  This test is no t yet approved or cleared by the [place] FDA and  has been authorized for detection and/or diagnosis of SARS-CoV-2 by FDA under an Emergency Use Authorization (EUA). This EUA will remain  in effect (meaning this test can be used) for the duration of the COVID-19 declaration under Section 564(b)(1) of the Act, 21 U.S.C.section 360bbb-3(b)(1), unless the authorization is terminated  or revoked sooner.       Influenza A by PCR NEGATIVE NEGATIVE Final   Influenza B by PCR NEGATIVE NEGATIVE Final    Comment: (NOTE) The Xpert  Xpress SARS-CoV-2/FLU/RSV plus assay is intended as an aid in the diagnosis of influenza from Nasopharyngeal swab specimens and should not be used as a sole basis for treatment. Nasal washings and aspirates are unacceptable for Xpert Xpress SARS-CoV-2/FLU/RSV testing.  Fact Sheet for Patients: EntrepreneurPulse.com.au  Fact Sheet for Healthcare Providers: IncredibleEmployment.be  This test is not yet approved or cleared by the [place] FDA and has been authorized for detection and/or diagnosis of SARS-CoV-2 by FDA under an Emergency Use Authorization (EUA). This EUA will remain in effect (meaning this test can be used) for the duration of the COVID-19 declaration under Section 564(b)(1) of the Act, 21 U.S.C. section 360bbb-3(b)(1), unless the authorization is terminated or revoked.  Performed at [facility], [address] [place], [place] [address]       Studies: No results found.  Scheduled Meds:  amLODipine  10 mg Oral Daily   aspirin  81 mg Oral Daily   atorvastatin  10 mg Oral Daily   carvedilol  12.5 mg Oral BID WC   enoxaparin (LOVENOX) injection  40 mg Subcutaneous Q24H   gabapentin  100 mg Oral QHS   gabapentin  300 mg Oral TID   magnesium oxide  400 mg Oral BID   mycophenolate  720 mg Oral BID   pantoprazole  40 mg Oral Daily   predniSONE  5 mg Oral Q breakfast   predniSONE  50 mg Oral Q6H   sodium chloride flush  3 mL Intravenous Q12H   tacrolimus ER  1 mg Oral Daily   tacrolimus ER  12 mg Oral Daily    Continuous Infusions:   LOS: 1 day     Kayleen Memos, MD [facility] Pager [phone]  If 7PM-7AM, please contact night-coverage [email or web address] Password [email or web address] 07/20/2021, 1:46 PM

## 2021-07-20 NOTE — Procedures (Signed)
Interventional Radiology Procedure Note  Procedure:  1) Right upper extremity venogram 2) Central venogram 3) Unsuccessful RUE venous stent recanalization 4) Midline placement  Findings: Please refer to procedural dictation for full description. Unsuccessful attempt and recanalization of right subclavian and innominate vein stents.  RUE midline placed for IV access while inpatient.  Complications: None immediate  Estimated Blood Loss: < 5 mL  Recommendations: 2 hours bedrest post procedure Recommend conservative measures (e.g. compression wraps, elevation, etc.) to improve pain/swelling   Ruthann Cancer, MD

## 2021-07-20 NOTE — Sedation Documentation (Signed)
Pt resting , vss

## 2021-07-20 NOTE — Progress Notes (Signed)
°  Transition of Care [facility]) Screening Note   Patient Details  Name: Nicholas Green Date of Birth: [birth year]   Transition of Care [facility]) CM/SW Contact:    Cyndi Bender, RN Phone Number: 07/20/2021, 8:43 AM    Transition of Care Department [facility]) has reviewed patient and no TOC needs have been identified at this time. We will continue to monitor patient advancement through interdisciplinary progression rounds. If new patient transition needs arise, please place a TOC consult.

## 2021-07-21 DIAGNOSIS — I10 Essential (primary) hypertension: Secondary | ICD-10-CM

## 2021-07-21 DIAGNOSIS — G629 Polyneuropathy, unspecified: Secondary | ICD-10-CM

## 2021-07-21 DIAGNOSIS — E785 Hyperlipidemia, unspecified: Secondary | ICD-10-CM

## 2021-07-21 DIAGNOSIS — E119 Type 2 diabetes mellitus without complications: Secondary | ICD-10-CM

## 2021-07-21 LAB — RENAL FUNCTION PANEL
Albumin: 3.2 g/dL — ABNORMAL LOW (ref 3.5–5.0)
Anion gap: 10 (ref 5–15)
BUN: 31 mg/dL — ABNORMAL HIGH (ref 6–20)
CO2: 24 mmol/L (ref 22–32)
Calcium: 9.6 mg/dL (ref 8.9–10.3)
Chloride: 105 mmol/L (ref 98–111)
Creatinine, Ser: 1.61 mg/dL — ABNORMAL HIGH (ref 0.61–1.24)
GFR, Estimated: 50 mL/min — ABNORMAL LOW (ref >60)
Glucose, Bld: 161 mg/dL — ABNORMAL HIGH (ref 70–99)
Phosphorus: 2.7 mg/dL (ref 2.5–4.6)
Potassium: 4.3 mmol/L (ref 3.5–5.1)
Sodium: 139 mmol/L (ref 135–145)

## 2021-07-21 LAB — CBC
HCT: 44.5 % (ref 39.0–52.0)
Hemoglobin: 14.7 g/dL (ref 13.0–17.0)
MCH: 30.6 pg (ref 26.0–34.0)
MCHC: 33 g/dL (ref 30.0–36.0)
MCV: 92.5 fL (ref 80.0–100.0)
Platelets: 182 10*3/uL (ref 150–400)
RBC: 4.81 MIL/uL (ref 4.22–5.81)
RDW: 13.8 % (ref 11.5–15.5)
WBC: 10.5 10*3/uL (ref 4.0–10.5)
nRBC: 0 % (ref 0.0–0.2)

## 2021-07-21 LAB — GLUCOSE, CAPILLARY
Glucose-Capillary: 161 mg/dL — ABNORMAL HIGH (ref 70–99)
Glucose-Capillary: 183 mg/dL — ABNORMAL HIGH (ref 70–99)

## 2021-07-21 NOTE — Plan of Care (Signed)

## 2021-07-21 NOTE — Discharge Summary (Signed)
PATIENT DETAILS Name: Nicholas Green Age: 56 y.o. Sex: male Date of Birth: 1966-03-19 MRN: [id number]. Admitting Physician: Nicholas Morton, MD [id number], Nicholas Beard, MD  Admit Date: 07/18/2021 Discharge date: 07/21/2021  Recommendations for Outpatient Follow-up:  Follow up with PCP in 1-2 weeks Please obtain CMP/CBC in one week  Admitted From:  Home  Disposition: Wineglass: No  Equipment/Devices: None  Discharge Condition: Stable  CODE STATUS: FULL CODE  Diet recommendation:  Diet Order             Diet - low sodium heart healthy           Diet Heart Room service appropriate? Yes; Fluid consistency: Thin  Diet effective now                    Brief Summary: Patient is a 56 y.o.  male history of ESRD-s/p renal transplant in 2021-who has had worsening right upper extremity lymphedema since his transplant surgery-presenting with worsening right arm lymphedema.  See below for further details.   Significant Hospital events: 1/31>> admit to [facility] for worsening right upper extremity lymphedema.  Per H&P-has had gradually worsening lymphedema since renal transplant in 2021.  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  Recommendations are for conservative measures including compression wraps/elevation to improve swelling.  At patient's request-vascular surgery was consulted over the phone,  Nicholas Green reviewed imaging/IR studies-and also discussed with IR MD.  Per Nicholas Green would not have done anything different than what IR has performed, he unfortunately has nothing further to offer in this case.  Since patient had a stent placed at [facility] asked him to see if he could seek another opinion there.  In the meantime-have asked nurse to apply compression dressing, I have educated patient regarding importance of elevation of the affected extremity.  Renal transplant, status post Renal function appears stable-continue tacrolimus, prednisone and mycophenolate.  Essential hypertension- (present on admission) BP stable-continue amlodipine, Coreg  Diabetes mellitus type 2, controlled (A1c 5.3 on 2/1) CBG stable on SSI-diet controlled in the outpatient setting.  HLD (hyperlipidemia) Continue statin  Peripheral neuropathy Continue Neurontin  Thrombocytopenia (Nicholas Green)- (present on admission) Resolved.  Unclear etiology.  BMI: Estimated body mass index is 29.82 kg/m as calculated from the following:   Height as of this encounter: 6\' 1"  (1.854 m).   Weight as of this encounter: 102.5 kg.     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  Active Problems:   Renal transplant, status post   Essential hypertension   Diabetes mellitus type 2, controlled (A1c 5.3 on 2/1)   HLD (hyperlipidemia)   Peripheral neuropathy   Thrombocytopenia (Nicholas Green)   Discharge Instructions:  Activity:  As tolerated   Discharge Instructions     Diet - low sodium heart healthy   Complete by: As directed    Discharge instructions   Complete by: As directed    Follow with Primary MD  Nicholas Mccreedy, MD in 1-2 weeks  Please get  a complete blood count and chemistry panel checked by your Primary MD at your next visit, and again as instructed by your Primary MD.  Get Medicines reviewed and adjusted: Please take all your medications with you  for your next visit with your Primary MD  Laboratory/radiological data: Please request your Primary MD to go over all hospital tests and procedure/radiological results at the follow up, please ask your Primary MD to get all Hospital records sent to his/her office.  In some cases, they will be blood work, cultures and biopsy results pending at the time of your discharge. Please request that your primary care M.D. follows up on these results.  Also Note the following: If you experience worsening of your admission symptoms, develop shortness of breath, life threatening emergency, suicidal or homicidal thoughts you must seek medical attention immediately by calling 911 or calling your MD immediately  if symptoms less severe.  You must read complete instructions/literature along with all the possible adverse reactions/side effects for all the Medicines you take and that have been prescribed to you. Take any new Medicines after you have completely understood and accpet all the possible adverse reactions/side effects.   Do not drive when taking Pain medications or sleeping medications (Benzodaizepines)  Do not take more than prescribed Pain, Sleep and Anxiety Medications. It is not advisable to combine anxiety,sleep and pain medications without talking with your primary care practitioner  Special Instructions: If you have smoked or chewed Tobacco  in the last 2 yrs please stop smoking, stop any regular Alcohol  and or any Recreational drug use.  Wear Seat belts while driving.  Please note: You were cared for by a hospitalist during your hospital stay. Once you are discharged, your primary care physician will handle any further medical issues. Please note that NO REFILLS for any discharge medications will be authorized once you are discharged, as it is imperative that you return to your primary care physician (or establish a relationship with a primary care physician if you do not have one) for your  post hospital discharge needs so that they can reassess your need for medications and monitor your lab values.   1.  Apply compressive bandage to affected extremity  2.  Keep affected extremity elevated   Increase activity slowly   Complete by: As directed    No dressing needed   Complete by: As directed       Allergies as of 07/21/2021       Reactions   Ivp Dye [iodinated Contrast Media] Swelling   Metrizamide Swelling        Medication List     TAKE these medications    amLODipine 10 MG tablet Commonly known as: NORVASC Take 10 mg by mouth daily.   aspirin 81 MG chewable tablet Chew 81 mg by mouth daily.   atorvastatin 10 MG tablet Commonly known as: LIPITOR Take 10 mg by mouth daily.   carvedilol 12.5 MG tablet Commonly known as: COREG Take 12.5 mg by mouth 2 (two) times daily with a meal.   tacrolimus ER 4 MG Tb24 Commonly known as: ENVARSUS XR Take 12 mg by mouth daily. 3 tablets daily = 12 mg   Envarsus XR 1 MG Tb24 Generic drug: tacrolimus ER Take 1 mg by mouth daily.   gabapentin 300 MG capsule Commonly known as: NEURONTIN Take 300 mg by mouth 3 (three) times daily.   gabapentin 100 MG capsule Commonly known as: NEURONTIN Take 100 mg by mouth at bedtime.  magnesium oxide 400 MG tablet Commonly known as: MAG-OX Take 800 mg by mouth 2 (two) times daily. 2 tabs oral twice a day   mycophenolate 360 MG Tbec EC tablet Commonly known as: MYFORTIC Take 720 mg by mouth 2 (two) times daily.   omeprazole 40 MG capsule Commonly known as: PRILOSEC Take 40 mg by mouth daily.   oxycodone 30 MG immediate release tablet Commonly known as: ROXICODONE Take 10 mg by mouth every 4 (four) hours as needed for pain.   predniSONE 5 MG tablet Commonly known as: DELTASONE Take 5 mg by mouth daily with breakfast.   Vitamin D (Ergocalciferol) 1.25 MG (50000 UNIT) Caps capsule Commonly known as: DRISDOL Take 50,000 Units by mouth every 30 (thirty) days.                Discharge Care Instructions  (From admission, onward)           Start     Ordered   07/21/21 0000  No dressing needed        07/21/21 1407            Follow-up Information     Osei-Bonsu, Nicholas Beard, MD. Schedule an appointment as soon as possible for a visit in 1 week(s).   Specialty: Internal Medicine Contact information: [id number] [place] [address] [phone]                Allergies  Allergen Reactions   Ivp Dye [Iodinated Contrast Media] Swelling   Metrizamide Swelling     Other Procedures/Studies: DG Wrist Complete Right  Result Date: 07/18/2021 CLINICAL DATA:  right arm swelling/pain EXAM: RIGHT WRIST - COMPLETE 3+ VIEW COMPARISON:  CT 06/07/2020 FINDINGS: There is no evidence of fracture or dislocation. No bony erosive change or focal osteopenia. Obliquity limits evaluation on the lateral radiograph. Mild first CMC and triscaphe degenerative change. Soft tissues edema. Small (3 mm) linear radiopaque foreign body within the volar and radial soft tissues at the level of the wrist. IMPRESSION: 1. Soft tissue edema without evidence of acute osseous abnormality. 2. Small (3 mm) linear radiopaque foreign body within the volar and radial soft tissues at the level of the wrist. 3. Mild first CMC and triscaphe degenerative change. Electronically Signed   By: Margaretha Sheffield M.D.   On: 07/18/2021 15:42   IR Venocavagram Svc  Result Date: 07/21/2021 INDICATION: 56 year old male with history of end-stage renal disease status post multiple upper extremity vascular access point for hemodialysis including right upper extremity fistula requiring right subclavian innominate vein stent placement years ago at an outside facility, left upper extremity HERO graft, and multiple prior tunneled hemodialysis catheters. The patient is now status post renal transplant with a functioning graft. The patient has experienced right upper extremity  swelling for years which is progressively worsened, now with extreme right forearm and hand pain with worsening swelling. Recent CT imaging demonstrates occlusion of the indwelling right subclavian innominate vein stents, with suspected etiology of right upper extremity swelling attributed to worsening venous thoracic outlet syndrome. Due to iodinated contrast allergy, effort to protect renal graft, and inability to obtain intravenous access, formal CT venogram evaluation is unable to be obtained. Therefore, the patient presents for right upper extremity venogram with possible central venous recanalization. EXAM: 1. Ultrasound-guided vascular access of the right basilic vein. 2. Ultrasound-guided vascular access of the right brachial vein. 3. Ultrasound-guided vascular access of the right common femoral vein. 4. Right upper extremity venogram. 5. Central  venogram. 6. Right upper extremity midline placement. COMPARISON:  None. MEDICATIONS: 50 mg Benadryl, intravenous ANESTHESIA/SEDATION: Versed 2.5 mg IV; Fentanyl 125 mcg IV Moderate Sedation Time:  33 The patient was continuously monitored during the procedure by the interventional radiology nurse under my direct supervision. FLUOROSCOPY TIME:  Fluoroscopy Time: 12.36 minutes (186 mGy). COMPLICATIONS: None immediate. TECHNIQUE: Informed written consent was obtained from the patient after a thorough discussion of the procedural risks, benefits and alternatives. All questions were addressed. Maximal Sterile Barrier Technique was utilized including caps, mask, sterile gowns, sterile gloves, sterile drape, hand hygiene and skin antiseptic. A timeout was performed prior to the initiation of the procedure. The right upper extremity and right groin were prepped and draped in standard fashion. The right basilic vein was evaluated with ultrasound which demonstrated compressible, free of internal echoes. Venous access was planned. Subdermal Local anesthesia was provided with  1% lidocaine. A small skin nick was made. Under direct ultrasound visualization, the right basilic vein was accessed with a 21 gauge micropuncture needle. A permanent image was captured and stored in the record. Micropuncture sheath was then inserted which aspirated and flushed appropriately. The sheath was then connected to tubing that was used for intravenous access/sedation for the procedure. Next, the right brachial vein was evaluated with ultrasound which demonstrated compressible, free of internal echoes. Venous access was planned. Subdermal Local anesthesia was provided with 1% lidocaine. A small skin nick was made. Under direct ultrasound visualization, the right brachial vein was accessed with a 21 gauge micropuncture needle. A permanent image was captured and stored in the record. Micropuncture set was inserted and a Wholey wire was directed under fluoroscopic guidance centrally. The wire was unable to engage in the indwelling subclavian vein stent. 0 dilation was performed and a 6 French sheath was placed. CO2 venogram of the right upper extremity was performed. This demonstrated patency of the brachial vein to the level of the axillary vein. The indwelling stents were totally occluded. There are multiple cervical and chest wall collateral veins present. A stiff Glidewire and Navicross catheter were inserted in attempts to recanalized the occluded stent. The sheath was advanced the level of the axillary vein. Additional venogram was performed with a small amount of diluted iodinated contrast which confirmed complete occlusion of the indwelling stent with multiple collateral veins present. Catheter and wire recanalization was unsuccessful. Therefore, the right common femoral vein was evaluated with ultrasound which demonstrated compressible, free of internal echoes. Venous access was planned. Subdermal Local anesthesia was provided with 1% lidocaine. A small skin nick was made. Under direct ultrasound  visualization, the right common femoral vein was accessed with a 21 gauge micropuncture needle. A permanent image was captured and stored in the record. Micropuncture sheath was placed and over a Wholey wire exchanged for a 9 [place], 10 cm vascular sheath. In coaxial fashion, the Wholey wire, Navicross catheter, and 7 [place], 65 cm sheath were inserted and directed to the level of the superior vena cava. The coaxial system was able to engage the central aspect of the indwelling innominate vein stent. Catheter wire recanalization of the more peripheral stent was also unsuccessful, including using the back of the stiff glidewire for semi sharp recanalization attempt. Central venogram was performed which demonstrated patency of the central aspect of the innominate vein stent with brisk antegrade flow into the superior vena cava. Next, direct puncture of the indwelling subclavian vein stent was then attempted under direct fluoroscopic and ultrasound visualization. A 0.018 inch Nitrex wire  was inserted and coiled within the peripheral aspect of the stent, unable to advance any farther in meeting much resistance. The needle and wire were then removed. Finally, from the upper extremity approach, additional catheter wire recanalization including using the back of the stiff glidewire for similar sharp recanalization was attempted, also unsuccessful. The right brachial and right common femoral catheters and sheath were removed and hemostasis was achieved with manual compression. The indwelling basilic vein access was exchanged over a Nitrex wire for a 5 French, dual-lumen, 15 cm PICC line with the tip positioned in the axillary vein (functionally a midline). A sterile bandage was applied. The patient tolerated the procedure well was transferred back to the floor in stable condition. 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 IMPRESSION: 1. Right upper extremity and central venogram  significant for complete occlusion of the indwelling right subclavian and innominate vein stents. 2. Unsuccessful recanalization of occluded right subclavian and innominate vein stents. 3. Placement of a right upper extremity midline catheter for inpatient intravenous access. Ruthann Cancer, MD [facility] Radiology Electronically Signed   By: Ruthann Cancer M.D.   On: 07/21/2021 09:40   US Venous Img Upper Right (DVT Study)  Result Date: 07/18/2021 CLINICAL DATA:  56 year old male with right upper extremity swelling. EXAM: RIGHT UPPER EXTREMITY VENOUS DOPPLER ULTRASOUND TECHNIQUE: Gray-scale sonography with graded compression, as well as color Doppler and duplex ultrasound were performed to evaluate the upper extremity deep venous system from the level of the subclavian vein and including the jugular, axillary, basilic, radial, ulnar and upper cephalic vein. Spectral Doppler was utilized to evaluate flow at rest and with distal augmentation maneuvers. COMPARISON:  CT chest from 06/08/2020, CT head and neck from 07/07/2021 FINDINGS: Contralateral Subclavian Vein: Respiratory phasicity is normal and symmetric with the symptomatic side. No evidence of thrombus. Normal compressibility. Internal Jugular Vein: No evidence of thrombus. Normal compressibility, respiratory phasicity and response to augmentation. Subclavian Vein: No evidence of thrombus. Absence of respiratory phasicity. Axillary Vein: No evidence of thrombus. Absence of respiratory phasicity. Cephalic Vein: No evidence of thrombus. Absence of respiratory phasicity. Normal compressibility. Basilic Vein: No evidence of thrombus. Absence of respiratory phasicity. Normal compressibility. Brachial Veins: No evidence of thrombus. Absence of respiratory phasicity. Normal compressibility. Radial Veins: No evidence of thrombus. Normal compressibility, respiratory phasicity and response to augmentation. Ulnar Veins:  No evidence of thrombus. Normal compressibility, respiratory phasicity and response to augmentation. Other Findings:  Diffuse right upper extremity subcutaneous edema. IMPRESSION: No evidence of right upper extremity deep vein thrombosis. 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 Consider CT venogram chest for further characterization and referral to Interventional Radiology or other qualified endovascular specialist. These results were called by telephone at the time of interpretation on 07/18/2021 at 4:27 pm to provider Sherwood Gambler , who verbally acknowledged these results. Ruthann Cancer, MD [facility] Radiology Electronically Signed   By: Ruthann Cancer M.D.   On: 07/18/2021 16:27   IR US Guide Vasc Access Right  Result Date: 07/21/2021 INDICATION: 56 year old male with history of end-stage renal disease status post multiple upper extremity vascular access point for hemodialysis including right upper extremity fistula requiring right subclavian innominate vein stent placement years ago at an outside facility, left upper extremity HERO graft, and multiple prior tunneled hemodialysis catheters. The patient is now status post  renal transplant with a functioning graft. The patient has experienced right upper extremity swelling for years which is progressively worsened, now with extreme right forearm and hand pain with worsening swelling. Recent CT imaging demonstrates occlusion of the indwelling right subclavian innominate vein stents, with suspected etiology of right upper extremity swelling attributed to worsening venous thoracic outlet syndrome. Due to iodinated contrast allergy, effort to protect renal graft, and inability to obtain intravenous access, formal CT venogram evaluation is unable to be obtained.  Therefore, the patient presents for right upper extremity venogram with possible central venous recanalization. EXAM: 1. Ultrasound-guided vascular access of the right basilic vein. 2. Ultrasound-guided vascular access of the right brachial vein. 3. Ultrasound-guided vascular access of the right common femoral vein. 4. Right upper extremity venogram. 5. Central venogram. 6. Right upper extremity midline placement. COMPARISON:  None. MEDICATIONS: 50 mg Benadryl, intravenous ANESTHESIA/SEDATION: Versed 2.5 mg IV; Fentanyl 125 mcg IV Moderate Sedation Time:  92 The patient was continuously monitored during the procedure by the interventional radiology nurse under my direct supervision. FLUOROSCOPY TIME:  Fluoroscopy Time: 12.36 minutes (186 mGy). COMPLICATIONS: None immediate. TECHNIQUE: Informed written consent was obtained from the patient after a thorough discussion of the procedural risks, benefits and alternatives. All questions were addressed. Maximal Sterile Barrier Technique was utilized including caps, mask, sterile gowns, sterile gloves, sterile drape, hand hygiene and skin antiseptic. A timeout was performed prior to the initiation of the procedure. The right upper extremity and right groin were prepped and draped in standard fashion. The right basilic vein was evaluated with ultrasound which demonstrated compressible, free of internal echoes. Venous access was planned. Subdermal Local anesthesia was provided with 1% lidocaine. A small skin nick was made. Under direct ultrasound visualization, the right basilic vein was accessed with a 21 gauge micropuncture needle. A permanent image was captured and stored in the record. Micropuncture sheath was then inserted which aspirated and flushed appropriately. The sheath was then connected to tubing that was used for intravenous access/sedation for the procedure. Next, the right brachial vein was evaluated with ultrasound which demonstrated compressible, free of  internal echoes. Venous access was planned. Subdermal Local anesthesia was provided with 1% lidocaine. A small skin nick was made. Under direct ultrasound visualization, the right brachial vein was accessed with a 21 gauge micropuncture needle. A permanent image was captured and stored in the record. Micropuncture set was inserted and a Wholey wire was directed under fluoroscopic guidance centrally. The wire was unable to engage in the indwelling subclavian vein stent. 0 dilation was performed and a 6 French sheath was placed. CO2 venogram of the right upper extremity was performed. This demonstrated patency of the brachial vein to the level of the axillary vein. The indwelling stents were totally occluded. There are multiple cervical and chest wall collateral veins present. A stiff Glidewire and Navicross catheter were inserted in attempts to recanalized the occluded stent. The sheath was advanced the level of the axillary vein. Additional venogram was performed with a small amount of diluted iodinated contrast which confirmed complete occlusion of the indwelling stent with multiple collateral veins present. Catheter and wire recanalization was unsuccessful. Therefore, the right common femoral vein was evaluated with ultrasound which demonstrated compressible, free of internal echoes. Venous access was planned. Subdermal Local anesthesia was provided with 1% lidocaine. A small skin nick was made. Under direct ultrasound visualization, the right common femoral vein was accessed with a 21 gauge micropuncture needle. A permanent image was captured and stored in  the record. Micropuncture sheath was placed and over a Wholey wire exchanged for a 9 [place], 10 cm vascular sheath. In coaxial fashion, the Wholey wire, Navicross catheter, and 7 [place], 65 cm sheath were inserted and directed to the level of the superior vena cava. The coaxial system was able to engage the central aspect of the indwelling innominate vein  stent. Catheter wire recanalization of the more peripheral stent was also unsuccessful, including using the back of the stiff glidewire for semi sharp recanalization attempt. Central venogram was performed which demonstrated patency of the central aspect of the innominate vein stent with brisk antegrade flow into the superior vena cava. Next, direct puncture of the indwelling subclavian vein stent was then attempted under direct fluoroscopic and ultrasound visualization. A 0.018 inch Nitrex wire was inserted and coiled within the peripheral aspect of the stent, unable to advance any farther in meeting much resistance. The needle and wire were then removed. Finally, from the upper extremity approach, additional catheter wire recanalization including using the back of the stiff glidewire for similar sharp recanalization was attempted, also unsuccessful. The right brachial and right common femoral catheters and sheath were removed and hemostasis was achieved with manual compression. The indwelling basilic vein access was exchanged over a Nitrex wire for a 5 French, dual-lumen, 15 cm PICC line with the tip positioned in the axillary vein (functionally a midline). A sterile bandage was applied. The patient tolerated the procedure well was transferred back to the floor in stable condition. 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 IMPRESSION: 1. Right upper extremity and central venogram significant for complete occlusion of the indwelling right subclavian and innominate vein stents. 2. Unsuccessful recanalization of occluded right subclavian and innominate vein stents. 3. Placement of a right upper extremity midline catheter for inpatient intravenous access. Ruthann Cancer, MD [facility] Radiology Electronically Signed   By: Ruthann Cancer M.D.   On: 07/21/2021 09:40   IR US Guide Vasc Access Right  Result Date:  07/21/2021 INDICATION: 56 year old male with history of end-stage renal disease status post multiple upper extremity vascular access point for hemodialysis including right upper extremity fistula requiring right subclavian innominate vein stent placement years ago at an outside facility, left upper extremity HERO graft, and multiple prior tunneled hemodialysis catheters. The patient is now status post renal transplant with a functioning graft. The patient has experienced right upper extremity swelling for years which is progressively worsened, now with extreme right forearm and hand pain with worsening swelling. Recent CT imaging demonstrates occlusion of the indwelling right subclavian innominate vein stents, with suspected etiology of right upper extremity swelling attributed to worsening venous thoracic outlet syndrome. Due to iodinated contrast allergy, effort to protect renal graft, and inability to obtain intravenous access, formal CT venogram evaluation is unable to be obtained. Therefore, the patient presents for right upper extremity venogram with possible central venous recanalization. EXAM: 1. Ultrasound-guided vascular access of the right basilic vein. 2. Ultrasound-guided vascular access of the right brachial vein. 3. Ultrasound-guided vascular access of the right common femoral vein. 4. Right upper extremity venogram. 5. Central venogram. 6. Right upper extremity midline placement. COMPARISON:  None. MEDICATIONS: 50 mg Benadryl, intravenous ANESTHESIA/SEDATION: Versed 2.5 mg IV; Fentanyl 125 mcg IV Moderate Sedation Time:  60 The patient was continuously monitored during the procedure by the interventional radiology nurse under my direct supervision. FLUOROSCOPY TIME:  Fluoroscopy Time: 12.36 minutes (186 mGy). COMPLICATIONS: None immediate. TECHNIQUE: Informed written  consent was obtained from the patient after a thorough discussion of the procedural risks, benefits and alternatives. All questions were  addressed. Maximal Sterile Barrier Technique was utilized including caps, mask, sterile gowns, sterile gloves, sterile drape, hand hygiene and skin antiseptic. A timeout was performed prior to the initiation of the procedure. The right upper extremity and right groin were prepped and draped in standard fashion. The right basilic vein was evaluated with ultrasound which demonstrated compressible, free of internal echoes. Venous access was planned. Subdermal Local anesthesia was provided with 1% lidocaine. A small skin nick was made. Under direct ultrasound visualization, the right basilic vein was accessed with a 21 gauge micropuncture needle. A permanent image was captured and stored in the record. Micropuncture sheath was then inserted which aspirated and flushed appropriately. The sheath was then connected to tubing that was used for intravenous access/sedation for the procedure. Next, the right brachial vein was evaluated with ultrasound which demonstrated compressible, free of internal echoes. Venous access was planned. Subdermal Local anesthesia was provided with 1% lidocaine. A small skin nick was made. Under direct ultrasound visualization, the right brachial vein was accessed with a 21 gauge micropuncture needle. A permanent image was captured and stored in the record. Micropuncture set was inserted and a Wholey wire was directed under fluoroscopic guidance centrally. The wire was unable to engage in the indwelling subclavian vein stent. 0 dilation was performed and a 6 French sheath was placed. CO2 venogram of the right upper extremity was performed. This demonstrated patency of the brachial vein to the level of the axillary vein. The indwelling stents were totally occluded. There are multiple cervical and chest wall collateral veins present. A stiff Glidewire and Navicross catheter were inserted in attempts to recanalized the occluded stent. The sheath was advanced the level of the axillary vein.  Additional venogram was performed with a small amount of diluted iodinated contrast which confirmed complete occlusion of the indwelling stent with multiple collateral veins present. Catheter and wire recanalization was unsuccessful. Therefore, the right common femoral vein was evaluated with ultrasound which demonstrated compressible, free of internal echoes. Venous access was planned. Subdermal Local anesthesia was provided with 1% lidocaine. A small skin nick was made. Under direct ultrasound visualization, the right common femoral vein was accessed with a 21 gauge micropuncture needle. A permanent image was captured and stored in the record. Micropuncture sheath was placed and over a Wholey wire exchanged for a 9 [place], 10 cm vascular sheath. In coaxial fashion, the Wholey wire, Navicross catheter, and 7 [place], 65 cm sheath were inserted and directed to the level of the superior vena cava. The coaxial system was able to engage the central aspect of the indwelling innominate vein stent. Catheter wire recanalization of the more peripheral stent was also unsuccessful, including using the back of the stiff glidewire for semi sharp recanalization attempt. Central venogram was performed which demonstrated patency of the central aspect of the innominate vein stent with brisk antegrade flow into the superior vena cava. Next, direct puncture of the indwelling subclavian vein stent was then attempted under direct fluoroscopic and ultrasound visualization. A 0.018 inch Nitrex wire was inserted and coiled within the peripheral aspect of the stent, unable to advance any farther in meeting much resistance. The needle and wire were then removed. Finally, from the upper extremity approach, additional catheter wire recanalization including using the back of the stiff glidewire for similar sharp recanalization was attempted, also unsuccessful. The right brachial and right common femoral catheters  and sheath were removed and  hemostasis was achieved with manual compression. The indwelling basilic vein access was exchanged over a Nitrex wire for a 5 French, dual-lumen, 15 cm PICC line with the tip positioned in the axillary vein (functionally a midline). A sterile bandage was applied. The patient tolerated the procedure well was transferred back to the floor in stable condition. 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 IMPRESSION: 1. Right upper extremity and central venogram significant for complete occlusion of the indwelling right subclavian and innominate vein stents. 2. Unsuccessful recanalization of occluded right subclavian and innominate vein stents. 3. Placement of a right upper extremity midline catheter for inpatient intravenous access. Ruthann Cancer, MD [facility] Radiology Electronically Signed   By: Ruthann Cancer M.D.   On: 07/21/2021 09:40   IR US Guide Vasc Access Right  Result Date: 07/21/2021 INDICATION: 56 year old male with history of end-stage renal disease status post multiple upper extremity vascular access point for hemodialysis including right upper extremity fistula requiring right subclavian innominate vein stent placement years ago at an outside facility, left upper extremity HERO graft, and multiple prior tunneled hemodialysis catheters. The patient is now status post renal transplant with a functioning graft. The patient has experienced right upper extremity swelling for years which is progressively worsened, now with extreme right forearm and hand pain with worsening swelling. Recent CT imaging demonstrates occlusion of the indwelling right subclavian innominate vein stents, with suspected etiology of right upper extremity swelling attributed to worsening venous thoracic outlet syndrome. Due to iodinated contrast allergy, effort to protect renal graft, and inability to obtain intravenous access, formal CT  venogram evaluation is unable to be obtained. Therefore, the patient presents for right upper extremity venogram with possible central venous recanalization. EXAM: 1. Ultrasound-guided vascular access of the right basilic vein. 2. Ultrasound-guided vascular access of the right brachial vein. 3. Ultrasound-guided vascular access of the right common femoral vein. 4. Right upper extremity venogram. 5. Central venogram. 6. Right upper extremity midline placement. COMPARISON:  None. MEDICATIONS: 50 mg Benadryl, intravenous ANESTHESIA/SEDATION: Versed 2.5 mg IV; Fentanyl 125 mcg IV Moderate Sedation Time:  65 The patient was continuously monitored during the procedure by the interventional radiology nurse under my direct supervision. FLUOROSCOPY TIME:  Fluoroscopy Time: 12.36 minutes (186 mGy). COMPLICATIONS: None immediate. TECHNIQUE: Informed written consent was obtained from the patient after a thorough discussion of the procedural risks, benefits and alternatives. All questions were addressed. Maximal Sterile Barrier Technique was utilized including caps, mask, sterile gowns, sterile gloves, sterile drape, hand hygiene and skin antiseptic. A timeout was performed prior to the initiation of the procedure. The right upper extremity and right groin were prepped and draped in standard fashion. The right basilic vein was evaluated with ultrasound which demonstrated compressible, free of internal echoes. Venous access was planned. Subdermal Local anesthesia was provided with 1% lidocaine. A small skin nick was made. Under direct ultrasound visualization, the right basilic vein was accessed with a 21 gauge micropuncture needle. A permanent image was captured and stored in the record. Micropuncture sheath was then inserted which aspirated and flushed appropriately. The sheath was then connected to tubing that was used for intravenous access/sedation for the procedure. Next, the right brachial vein was evaluated with  ultrasound which demonstrated compressible, free of internal echoes. Venous access was planned. Subdermal Local anesthesia was provided with 1% lidocaine. A small skin nick was made. Under direct ultrasound visualization, the right brachial vein was  accessed with a 21 gauge micropuncture needle. A permanent image was captured and stored in the record. Micropuncture set was inserted and a Wholey wire was directed under fluoroscopic guidance centrally. The wire was unable to engage in the indwelling subclavian vein stent. 0 dilation was performed and a 6 French sheath was placed. CO2 venogram of the right upper extremity was performed. This demonstrated patency of the brachial vein to the level of the axillary vein. The indwelling stents were totally occluded. There are multiple cervical and chest wall collateral veins present. A stiff Glidewire and Navicross catheter were inserted in attempts to recanalized the occluded stent. The sheath was advanced the level of the axillary vein. Additional venogram was performed with a small amount of diluted iodinated contrast which confirmed complete occlusion of the indwelling stent with multiple collateral veins present. Catheter and wire recanalization was unsuccessful. Therefore, the right common femoral vein was evaluated with ultrasound which demonstrated compressible, free of internal echoes. Venous access was planned. Subdermal Local anesthesia was provided with 1% lidocaine. A small skin nick was made. Under direct ultrasound visualization, the right common femoral vein was accessed with a 21 gauge micropuncture needle. A permanent image was captured and stored in the record. Micropuncture sheath was placed and over a Wholey wire exchanged for a 9 [place], 10 cm vascular sheath. In coaxial fashion, the Wholey wire, Navicross catheter, and 7 [place], 65 cm sheath were inserted and directed to the level of the superior vena cava. The coaxial system was able to engage the  central aspect of the indwelling innominate vein stent. Catheter wire recanalization of the more peripheral stent was also unsuccessful, including using the back of the stiff glidewire for semi sharp recanalization attempt. Central venogram was performed which demonstrated patency of the central aspect of the innominate vein stent with brisk antegrade flow into the superior vena cava. Next, direct puncture of the indwelling subclavian vein stent was then attempted under direct fluoroscopic and ultrasound visualization. A 0.018 inch Nitrex wire was inserted and coiled within the peripheral aspect of the stent, unable to advance any farther in meeting much resistance. The needle and wire were then removed. Finally, from the upper extremity approach, additional catheter wire recanalization including using the back of the stiff glidewire for similar sharp recanalization was attempted, also unsuccessful. The right brachial and right common femoral catheters and sheath were removed and hemostasis was achieved with manual compression. The indwelling basilic vein access was exchanged over a Nitrex wire for a 5 French, dual-lumen, 15 cm PICC line with the tip positioned in the axillary vein (functionally a midline). A sterile bandage was applied. The patient tolerated the procedure well was transferred back to the floor in stable condition. 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 IMPRESSION: 1. Right upper extremity and central venogram significant for complete occlusion of the indwelling right subclavian and innominate vein stents. 2. Unsuccessful recanalization of occluded right subclavian and innominate vein stents. 3. Placement of a right upper extremity midline catheter for inpatient intravenous access. Ruthann Cancer, MD [facility] Radiology Electronically Signed   By: Ruthann Cancer M.D.   On: 07/21/2021 09:40   IR PICC  PLACEMENT RIGHT <5 YRS INC IMG GUIDE  Result Date: 07/21/2021 INDICATION: 56 year old male with history of end-stage renal disease status post multiple upper extremity vascular access point for hemodialysis including right upper extremity fistula requiring right subclavian innominate vein stent placement years ago at an outside facility,  left upper extremity HERO graft, and multiple prior tunneled hemodialysis catheters. The patient is now status post renal transplant with a functioning graft. The patient has experienced right upper extremity swelling for years which is progressively worsened, now with extreme right forearm and hand pain with worsening swelling. Recent CT imaging demonstrates occlusion of the indwelling right subclavian innominate vein stents, with suspected etiology of right upper extremity swelling attributed to worsening venous thoracic outlet syndrome. Due to iodinated contrast allergy, effort to protect renal graft, and inability to obtain intravenous access, formal CT venogram evaluation is unable to be obtained. Therefore, the patient presents for right upper extremity venogram with possible central venous recanalization. EXAM: 1. Ultrasound-guided vascular access of the right basilic vein. 2. Ultrasound-guided vascular access of the right brachial vein. 3. Ultrasound-guided vascular access of the right common femoral vein. 4. Right upper extremity venogram. 5. Central venogram. 6. Right upper extremity midline placement. COMPARISON:  None. MEDICATIONS: 50 mg Benadryl, intravenous ANESTHESIA/SEDATION: Versed 2.5 mg IV; Fentanyl 125 mcg IV Moderate Sedation Time:  76 The patient was continuously monitored during the procedure by the interventional radiology nurse under my direct supervision. FLUOROSCOPY TIME:  Fluoroscopy Time: 12.36 minutes (186 mGy). COMPLICATIONS: None immediate. TECHNIQUE: Informed written consent was obtained from the patient after a thorough discussion of the procedural  risks, benefits and alternatives. All questions were addressed. Maximal Sterile Barrier Technique was utilized including caps, mask, sterile gowns, sterile gloves, sterile drape, hand hygiene and skin antiseptic. A timeout was performed prior to the initiation of the procedure. The right upper extremity and right groin were prepped and draped in standard fashion. The right basilic vein was evaluated with ultrasound which demonstrated compressible, free of internal echoes. Venous access was planned. Subdermal Local anesthesia was provided with 1% lidocaine. A small skin nick was made. Under direct ultrasound visualization, the right basilic vein was accessed with a 21 gauge micropuncture needle. A permanent image was captured and stored in the record. Micropuncture sheath was then inserted which aspirated and flushed appropriately. The sheath was then connected to tubing that was used for intravenous access/sedation for the procedure. Next, the right brachial vein was evaluated with ultrasound which demonstrated compressible, free of internal echoes. Venous access was planned. Subdermal Local anesthesia was provided with 1% lidocaine. A small skin nick was made. Under direct ultrasound visualization, the right brachial vein was accessed with a 21 gauge micropuncture needle. A permanent image was captured and stored in the record. Micropuncture set was inserted and a Wholey wire was directed under fluoroscopic guidance centrally. The wire was unable to engage in the indwelling subclavian vein stent. 0 dilation was performed and a 6 French sheath was placed. CO2 venogram of the right upper extremity was performed. This demonstrated patency of the brachial vein to the level of the axillary vein. The indwelling stents were totally occluded. There are multiple cervical and chest wall collateral veins present. A stiff Glidewire and Navicross catheter were inserted in attempts to recanalized the occluded stent. The sheath  was advanced the level of the axillary vein. Additional venogram was performed with a small amount of diluted iodinated contrast which confirmed complete occlusion of the indwelling stent with multiple collateral veins present. Catheter and wire recanalization was unsuccessful. Therefore, the right common femoral vein was evaluated with ultrasound which demonstrated compressible, free of internal echoes. Venous access was planned. Subdermal Local anesthesia was provided with 1% lidocaine. A small skin nick was made. Under direct ultrasound visualization, the right common femoral  vein was accessed with a 21 gauge micropuncture needle. A permanent image was captured and stored in the record. Micropuncture sheath was placed and over a Wholey wire exchanged for a 9 [place], 10 cm vascular sheath. In coaxial fashion, the Wholey wire, Navicross catheter, and 7 [place], 65 cm sheath were inserted and directed to the level of the superior vena cava. The coaxial system was able to engage the central aspect of the indwelling innominate vein stent. Catheter wire recanalization of the more peripheral stent was also unsuccessful, including using the back of the stiff glidewire for semi sharp recanalization attempt. Central venogram was performed which demonstrated patency of the central aspect of the innominate vein stent with brisk antegrade flow into the superior vena cava. Next, direct puncture of the indwelling subclavian vein stent was then attempted under direct fluoroscopic and ultrasound visualization. A 0.018 inch Nitrex wire was inserted and coiled within the peripheral aspect of the stent, unable to advance any farther in meeting much resistance. The needle and wire were then removed. Finally, from the upper extremity approach, additional catheter wire recanalization including using the back of the stiff glidewire for similar sharp recanalization was attempted, also unsuccessful. The right brachial and right common  femoral catheters and sheath were removed and hemostasis was achieved with manual compression. The indwelling basilic vein access was exchanged over a Nitrex wire for a 5 French, dual-lumen, 15 cm PICC line with the tip positioned in the axillary vein (functionally a midline). A sterile bandage was applied. The patient tolerated the procedure well was transferred back to the floor in stable condition. 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 IMPRESSION: 1. Right upper extremity and central venogram significant for complete occlusion of the indwelling right subclavian and innominate vein stents. 2. Unsuccessful recanalization of occluded right subclavian and innominate vein stents. 3. Placement of a right upper extremity midline catheter for inpatient intravenous access. Ruthann Cancer, MD [facility] Radiology Electronically Signed   By: Ruthann Cancer M.D.   On: 07/21/2021 09:40     TODAY-DAY OF DISCHARGE:  Subjective:   Nicholas Green today has no headache,no chest abdominal pain,no new weakness tingling or numbness Objective:   Blood pressure (!) 172/98, pulse 63, temperature 98 F (36.7 C), temperature source Oral, resp. rate 19, height 6\' 1"  (1.854 m), weight 102.5 kg, SpO2 98 %.  Intake/Output Summary (Last 24 hours) at 07/21/2021 1409 Last data filed at 07/21/2021 1210 Gross per 24 hour  Intake 118 ml  Output 1000 ml  Net -882 ml   Filed Weights   07/18/21 1250  Weight: 102.5 kg    Exam: Awake Alert, Oriented *3, No new F.N deficits, Normal affect [place].AT,PERRAL Supple Neck,No JVD, No cervical lymphadenopathy appriciated.  Symmetrical Chest wall movement, Good air movement bilaterally, CTAB RRR,No Gallops,Rubs or new Murmurs, No Parasternal Heave +ve B.Sounds, Abd Soft, Non tender, No organomegaly appriciated, No rebound -guarding or rigidity. No Cyanosis, Clubbing or edema, No new Rash or  bruise   PERTINENT RADIOLOGIC STUDIES: IR Venocavagram Svc  Result Date: 07/21/2021 INDICATION: 56 year old male with history of end-stage renal disease status post multiple upper extremity vascular access point for hemodialysis including right upper extremity fistula requiring right subclavian innominate vein stent placement years ago at an outside facility, left upper extremity HERO graft, and multiple prior tunneled hemodialysis catheters. The patient is now status post renal transplant with a functioning graft. The patient has experienced right upper extremity swelling for  years which is progressively worsened, now with extreme right forearm and hand pain with worsening swelling. Recent CT imaging demonstrates occlusion of the indwelling right subclavian innominate vein stents, with suspected etiology of right upper extremity swelling attributed to worsening venous thoracic outlet syndrome. Due to iodinated contrast allergy, effort to protect renal graft, and inability to obtain intravenous access, formal CT venogram evaluation is unable to be obtained. Therefore, the patient presents for right upper extremity venogram with possible central venous recanalization. EXAM: 1. Ultrasound-guided vascular access of the right basilic vein. 2. Ultrasound-guided vascular access of the right brachial vein. 3. Ultrasound-guided vascular access of the right common femoral vein. 4. Right upper extremity venogram. 5. Central venogram. 6. Right upper extremity midline placement. COMPARISON:  None. MEDICATIONS: 50 mg Benadryl, intravenous ANESTHESIA/SEDATION: Versed 2.5 mg IV; Fentanyl 125 mcg IV Moderate Sedation Time:  56 The patient was continuously monitored during the procedure by the interventional radiology nurse under my direct supervision. FLUOROSCOPY TIME:  Fluoroscopy Time: 12.36 minutes (186 mGy). COMPLICATIONS: None immediate. TECHNIQUE: Informed written consent was obtained from the patient after a thorough  discussion of the procedural risks, benefits and alternatives. All questions were addressed. Maximal Sterile Barrier Technique was utilized including caps, mask, sterile gowns, sterile gloves, sterile drape, hand hygiene and skin antiseptic. A timeout was performed prior to the initiation of the procedure. The right upper extremity and right groin were prepped and draped in standard fashion. The right basilic vein was evaluated with ultrasound which demonstrated compressible, free of internal echoes. Venous access was planned. Subdermal Local anesthesia was provided with 1% lidocaine. A small skin nick was made. Under direct ultrasound visualization, the right basilic vein was accessed with a 21 gauge micropuncture needle. A permanent image was captured and stored in the record. Micropuncture sheath was then inserted which aspirated and flushed appropriately. The sheath was then connected to tubing that was used for intravenous access/sedation for the procedure. Next, the right brachial vein was evaluated with ultrasound which demonstrated compressible, free of internal echoes. Venous access was planned. Subdermal Local anesthesia was provided with 1% lidocaine. A small skin nick was made. Under direct ultrasound visualization, the right brachial vein was accessed with a 21 gauge micropuncture needle. A permanent image was captured and stored in the record. Micropuncture set was inserted and a Wholey wire was directed under fluoroscopic guidance centrally. The wire was unable to engage in the indwelling subclavian vein stent. 0 dilation was performed and a 6 French sheath was placed. CO2 venogram of the right upper extremity was performed. This demonstrated patency of the brachial vein to the level of the axillary vein. The indwelling stents were totally occluded. There are multiple cervical and chest wall collateral veins present. A stiff Glidewire and Navicross catheter were inserted in attempts to recanalized the  occluded stent. The sheath was advanced the level of the axillary vein. Additional venogram was performed with a small amount of diluted iodinated contrast which confirmed complete occlusion of the indwelling stent with multiple collateral veins present. Catheter and wire recanalization was unsuccessful. Therefore, the right common femoral vein was evaluated with ultrasound which demonstrated compressible, free of internal echoes. Venous access was planned. Subdermal Local anesthesia was provided with 1% lidocaine. A small skin nick was made. Under direct ultrasound visualization, the right common femoral vein was accessed with a 21 gauge micropuncture needle. A permanent image was captured and stored in the record. Micropuncture sheath was placed and over a Wholey wire exchanged for a 9  [place], 10 cm vascular sheath. In coaxial fashion, the Wholey wire, Navicross catheter, and 7 [place], 65 cm sheath were inserted and directed to the level of the superior vena cava. The coaxial system was able to engage the central aspect of the indwelling innominate vein stent. Catheter wire recanalization of the more peripheral stent was also unsuccessful, including using the back of the stiff glidewire for semi sharp recanalization attempt. Central venogram was performed which demonstrated patency of the central aspect of the innominate vein stent with brisk antegrade flow into the superior vena cava. Next, direct puncture of the indwelling subclavian vein stent was then attempted under direct fluoroscopic and ultrasound visualization. A 0.018 inch Nitrex wire was inserted and coiled within the peripheral aspect of the stent, unable to advance any farther in meeting much resistance. The needle and wire were then removed. Finally, from the upper extremity approach, additional catheter wire recanalization including using the back of the stiff glidewire for similar sharp recanalization was attempted, also unsuccessful. The right  brachial and right common femoral catheters and sheath were removed and hemostasis was achieved with manual compression. The indwelling basilic vein access was exchanged over a Nitrex wire for a 5 French, dual-lumen, 15 cm PICC line with the tip positioned in the axillary vein (functionally a midline). A sterile bandage was applied. The patient tolerated the procedure well was transferred back to the floor in stable condition. 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 IMPRESSION: 1. Right upper extremity and central venogram significant for complete occlusion of the indwelling right subclavian and innominate vein stents. 2. Unsuccessful recanalization of occluded right subclavian and innominate vein stents. 3. Placement of a right upper extremity midline catheter for inpatient intravenous access. Ruthann Cancer, MD [facility] Electronically Signed   By: Ruthann Cancer M.D.   On: 07/21/2021 09:40   IR US Guide Vasc Access Right  Result Date: 07/21/2021 INDICATION: 56 year old male with history of end-stage renal disease status post multiple upper extremity vascular access point for hemodialysis including right upper extremity fistula requiring right subclavian innominate vein stent placement years ago at an outside facility, left upper extremity HERO graft, and multiple prior tunneled hemodialysis catheters. The patient is now status post renal transplant with a functioning graft. The patient has experienced right upper extremity swelling for years which is progressively worsened, now with extreme right forearm and hand pain with worsening swelling. Recent CT imaging demonstrates occlusion of the indwelling right subclavian innominate vein stents, with suspected etiology of right upper extremity swelling attributed to worsening venous thoracic outlet syndrome. Due to iodinated contrast allergy, effort to protect  renal graft, and inability to obtain intravenous access, formal CT venogram evaluation is unable to be obtained. Therefore, the patient presents for right upper extremity venogram with possible central venous recanalization. EXAM: 1. Ultrasound-guided vascular access of the right basilic vein. 2. Ultrasound-guided vascular access of the right brachial vein. 3. Ultrasound-guided vascular access of the right common femoral vein. 4. Right upper extremity venogram. 5. Central venogram. 6. Right upper extremity midline placement. COMPARISON:  None. MEDICATIONS: 50 mg Benadryl, intravenous ANESTHESIA/SEDATION: Versed 2.5 mg IV; Fentanyl 125 mcg IV Moderate Sedation Time:  77 The patient was continuously monitored during the procedure by the interventional radiology nurse under my direct supervision. FLUOROSCOPY TIME:  Fluoroscopy Time: 12.36 minutes (186 mGy). COMPLICATIONS: None immediate. TECHNIQUE: Informed written consent was obtained from the patient after a thorough discussion of the procedural risks, benefits  and alternatives. All questions were addressed. Maximal Sterile Barrier Technique was utilized including caps, mask, sterile gowns, sterile gloves, sterile drape, hand hygiene and skin antiseptic. A timeout was performed prior to the initiation of the procedure. The right upper extremity and right groin were prepped and draped in standard fashion. The right basilic vein was evaluated with ultrasound which demonstrated compressible, free of internal echoes. Venous access was planned. Subdermal Local anesthesia was provided with 1% lidocaine. A small skin nick was made. Under direct ultrasound visualization, the right basilic vein was accessed with a 21 gauge micropuncture needle. A permanent image was captured and stored in the record. Micropuncture sheath was then inserted which aspirated and flushed appropriately. The sheath was then connected to tubing that was used for intravenous access/sedation for the  procedure. Next, the right brachial vein was evaluated with ultrasound which demonstrated compressible, free of internal echoes. Venous access was planned. Subdermal Local anesthesia was provided with 1% lidocaine. A small skin nick was made. Under direct ultrasound visualization, the right brachial vein was accessed with a 21 gauge micropuncture needle. A permanent image was captured and stored in the record. Micropuncture set was inserted and a Wholey wire was directed under fluoroscopic guidance centrally. The wire was unable to engage in the indwelling subclavian vein stent. 0 dilation was performed and a 6 French sheath was placed. CO2 venogram of the right upper extremity was performed. This demonstrated patency of the brachial vein to the level of the axillary vein. The indwelling stents were totally occluded. There are multiple cervical and chest wall collateral veins present. A stiff Glidewire and Navicross catheter were inserted in attempts to recanalized the occluded stent. The sheath was advanced the level of the axillary vein. Additional venogram was performed with a small amount of diluted iodinated contrast which confirmed complete occlusion of the indwelling stent with multiple collateral veins present. Catheter and wire recanalization was unsuccessful. Therefore, the right common femoral vein was evaluated with ultrasound which demonstrated compressible, free of internal echoes. Venous access was planned. Subdermal Local anesthesia was provided with 1% lidocaine. A small skin nick was made. Under direct ultrasound visualization, the right common femoral vein was accessed with a 21 gauge micropuncture needle. A permanent image was captured and stored in the record. Micropuncture sheath was placed and over a Wholey wire exchanged for a 9 [place], 10 cm vascular sheath. In coaxial fashion, the Wholey wire, Navicross catheter, and 7 [place], 65 cm sheath were inserted and directed to the level of the  superior vena cava. The coaxial system was able to engage the central aspect of the indwelling innominate vein stent. Catheter wire recanalization of the more peripheral stent was also unsuccessful, including using the back of the stiff glidewire for semi sharp recanalization attempt. Central venogram was performed which demonstrated patency of the central aspect of the innominate vein stent with brisk antegrade flow into the superior vena cava. Next, direct puncture of the indwelling subclavian vein stent was then attempted under direct fluoroscopic and ultrasound visualization. A 0.018 inch Nitrex wire was inserted and coiled within the peripheral aspect of the stent, unable to advance any farther in meeting much resistance. The needle and wire were then removed. Finally, from the upper extremity approach, additional catheter wire recanalization including using the back of the stiff glidewire for similar sharp recanalization was attempted, also unsuccessful. The right brachial and right common femoral catheters and sheath were removed and hemostasis was achieved with manual compression. The indwelling basilic vein  access was exchanged over a Nitrex wire for a 5 French, dual-lumen, 15 cm PICC line with the tip positioned in the axillary vein (functionally a midline). A sterile bandage was applied. The patient tolerated the procedure well was transferred back to the floor in stable condition. 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 IMPRESSION: 1. Right upper extremity and central venogram significant for complete occlusion of the indwelling right subclavian and innominate vein stents. 2. Unsuccessful recanalization of occluded right subclavian and innominate vein stents. 3. Placement of a right upper extremity midline catheter for inpatient intravenous access. Ruthann Cancer, MD [facility] Electronically Signed    By: Ruthann Cancer M.D.   On: 07/21/2021 09:40   IR US Guide Vasc Access Right  Result Date: 07/21/2021 INDICATION: 56 year old male with history of end-stage renal disease status post multiple upper extremity vascular access point for hemodialysis including right upper extremity fistula requiring right subclavian innominate vein stent placement years ago at an outside facility, left upper extremity HERO graft, and multiple prior tunneled hemodialysis catheters. The patient is now status post renal transplant with a functioning graft. The patient has experienced right upper extremity swelling for years which is progressively worsened, now with extreme right forearm and hand pain with worsening swelling. Recent CT imaging demonstrates occlusion of the indwelling right subclavian innominate vein stents, with suspected etiology of right upper extremity swelling attributed to worsening venous thoracic outlet syndrome. Due to iodinated contrast allergy, effort to protect renal graft, and inability to obtain intravenous access, formal CT venogram evaluation is unable to be obtained. Therefore, the patient presents for right upper extremity venogram with possible central venous recanalization. EXAM: 1. Ultrasound-guided vascular access of the right basilic vein. 2. Ultrasound-guided vascular access of the right brachial vein. 3. Ultrasound-guided vascular access of the right common femoral vein. 4. Right upper extremity venogram. 5. Central venogram. 6. Right upper extremity midline placement. COMPARISON:  None. MEDICATIONS: 50 mg Benadryl, intravenous ANESTHESIA/SEDATION: Versed 2.5 mg IV; Fentanyl 125 mcg IV Moderate Sedation Time:  104 The patient was continuously monitored during the procedure by the interventional radiology nurse under my direct supervision. FLUOROSCOPY TIME:  Fluoroscopy Time: 12.36 minutes (186 mGy). COMPLICATIONS: None immediate. TECHNIQUE: Informed written consent was obtained from the patient  after a thorough discussion of the procedural risks, benefits and alternatives. All questions were addressed. Maximal Sterile Barrier Technique was utilized including caps, mask, sterile gowns, sterile gloves, sterile drape, hand hygiene and skin antiseptic. A timeout was performed prior to the initiation of the procedure. The right upper extremity and right groin were prepped and draped in standard fashion. The right basilic vein was evaluated with ultrasound which demonstrated compressible, free of internal echoes. Venous access was planned. Subdermal Local anesthesia was provided with 1% lidocaine. A small skin nick was made. Under direct ultrasound visualization, the right basilic vein was accessed with a 21 gauge micropuncture needle. A permanent image was captured and stored in the record. Micropuncture sheath was then inserted which aspirated and flushed appropriately. The sheath was then connected to tubing that was used for intravenous access/sedation for the procedure. Next, the right brachial vein was evaluated with ultrasound which demonstrated compressible, free of internal echoes. Venous access was planned. Subdermal Local anesthesia was provided with 1% lidocaine. A small skin nick was made. Under direct ultrasound visualization, the right brachial vein was accessed with a 21 gauge micropuncture needle. A permanent image was captured and stored in the  record. Micropuncture set was inserted and a Wholey wire was directed under fluoroscopic guidance centrally. The wire was unable to engage in the indwelling subclavian vein stent. 0 dilation was performed and a 6 French sheath was placed. CO2 venogram of the right upper extremity was performed. This demonstrated patency of the brachial vein to the level of the axillary vein. The indwelling stents were totally occluded. There are multiple cervical and chest wall collateral veins present. A stiff Glidewire and Navicross catheter were inserted in attempts  to recanalized the occluded stent. The sheath was advanced the level of the axillary vein. Additional venogram was performed with a small amount of diluted iodinated contrast which confirmed complete occlusion of the indwelling stent with multiple collateral veins present. Catheter and wire recanalization was unsuccessful. Therefore, the right common femoral vein was evaluated with ultrasound which demonstrated compressible, free of internal echoes. Venous access was planned. Subdermal Local anesthesia was provided with 1% lidocaine. A small skin nick was made. Under direct ultrasound visualization, the right common femoral vein was accessed with a 21 gauge micropuncture needle. A permanent image was captured and stored in the record. Micropuncture sheath was placed and over a Wholey wire exchanged for a 9 [place], 10 cm vascular sheath. In coaxial fashion, the Wholey wire, Navicross catheter, and 7 [place], 65 cm sheath were inserted and directed to the level of the superior vena cava. The coaxial system was able to engage the central aspect of the indwelling innominate vein stent. Catheter wire recanalization of the more peripheral stent was also unsuccessful, including using the back of the stiff glidewire for semi sharp recanalization attempt. Central venogram was performed which demonstrated patency of the central aspect of the innominate vein stent with brisk antegrade flow into the superior vena cava. Next, direct puncture of the indwelling subclavian vein stent was then attempted under direct fluoroscopic and ultrasound visualization. A 0.018 inch Nitrex wire was inserted and coiled within the peripheral aspect of the stent, unable to advance any farther in meeting much resistance. The needle and wire were then removed. Finally, from the upper extremity approach, additional catheter wire recanalization including using the back of the stiff glidewire for similar sharp recanalization was attempted, also  unsuccessful. The right brachial and right common femoral catheters and sheath were removed and hemostasis was achieved with manual compression. The indwelling basilic vein access was exchanged over a Nitrex wire for a 5 French, dual-lumen, 15 cm PICC line with the tip positioned in the axillary vein (functionally a midline). A sterile bandage was applied. The patient tolerated the procedure well was transferred back to the floor in stable condition. 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 IMPRESSION: 1. Right upper extremity and central venogram significant for complete occlusion of the indwelling right subclavian and innominate vein stents. 2. Unsuccessful recanalization of occluded right subclavian and innominate vein stents. 3. Placement of a right upper extremity midline catheter for inpatient intravenous access. Ruthann Cancer, MD [facility] Radiology Electronically Signed   By: Ruthann Cancer M.D.   On: 07/21/2021 09:40   IR US Guide Vasc Access Right  Result Date: 07/21/2021 INDICATION: 56 year old male with history of end-stage renal disease status post multiple upper extremity vascular access point for hemodialysis including right upper extremity fistula requiring right subclavian innominate vein stent placement years ago at an outside facility, left upper extremity HERO graft, and multiple prior tunneled hemodialysis catheters. The patient is now status post renal transplant  with a functioning graft. The patient has experienced right upper extremity swelling for years which is progressively worsened, now with extreme right forearm and hand pain with worsening swelling. Recent CT imaging demonstrates occlusion of the indwelling right subclavian innominate vein stents, with suspected etiology of right upper extremity swelling attributed to worsening venous thoracic outlet syndrome. Due to iodinated contrast  allergy, effort to protect renal graft, and inability to obtain intravenous access, formal CT venogram evaluation is unable to be obtained. Therefore, the patient presents for right upper extremity venogram with possible central venous recanalization. EXAM: 1. Ultrasound-guided vascular access of the right basilic vein. 2. Ultrasound-guided vascular access of the right brachial vein. 3. Ultrasound-guided vascular access of the right common femoral vein. 4. Right upper extremity venogram. 5. Central venogram. 6. Right upper extremity midline placement. COMPARISON:  None. MEDICATIONS: 50 mg Benadryl, intravenous ANESTHESIA/SEDATION: Versed 2.5 mg IV; Fentanyl 125 mcg IV Moderate Sedation Time:  49 The patient was continuously monitored during the procedure by the interventional radiology nurse under my direct supervision. FLUOROSCOPY TIME:  Fluoroscopy Time: 12.36 minutes (186 mGy). COMPLICATIONS: None immediate. TECHNIQUE: Informed written consent was obtained from the patient after a thorough discussion of the procedural risks, benefits and alternatives. All questions were addressed. Maximal Sterile Barrier Technique was utilized including caps, mask, sterile gowns, sterile gloves, sterile drape, hand hygiene and skin antiseptic. A timeout was performed prior to the initiation of the procedure. The right upper extremity and right groin were prepped and draped in standard fashion. The right basilic vein was evaluated with ultrasound which demonstrated compressible, free of internal echoes. Venous access was planned. Subdermal Local anesthesia was provided with 1% lidocaine. A small skin nick was made. Under direct ultrasound visualization, the right basilic vein was accessed with a 21 gauge micropuncture needle. A permanent image was captured and stored in the record. Micropuncture sheath was then inserted which aspirated and flushed appropriately. The sheath was then connected to tubing that was used for intravenous  access/sedation for the procedure. Next, the right brachial vein was evaluated with ultrasound which demonstrated compressible, free of internal echoes. Venous access was planned. Subdermal Local anesthesia was provided with 1% lidocaine. A small skin nick was made. Under direct ultrasound visualization, the right brachial vein was accessed with a 21 gauge micropuncture needle. A permanent image was captured and stored in the record. Micropuncture set was inserted and a Wholey wire was directed under fluoroscopic guidance centrally. The wire was unable to engage in the indwelling subclavian vein stent. 0 dilation was performed and a 6 French sheath was placed. CO2 venogram of the right upper extremity was performed. This demonstrated patency of the brachial vein to the level of the axillary vein. The indwelling stents were totally occluded. There are multiple cervical and chest wall collateral veins present. A stiff Glidewire and Navicross catheter were inserted in attempts to recanalized the occluded stent. The sheath was advanced the level of the axillary vein. Additional venogram was performed with a small amount of diluted iodinated contrast which confirmed complete occlusion of the indwelling stent with multiple collateral veins present. Catheter and wire recanalization was unsuccessful. Therefore, the right common femoral vein was evaluated with ultrasound which demonstrated compressible, free of internal echoes. Venous access was planned. Subdermal Local anesthesia was provided with 1% lidocaine. A small skin nick was made. Under direct ultrasound visualization, the right common femoral vein was accessed with a 21 gauge micropuncture needle. A permanent image was captured and stored in the record.  Micropuncture sheath was placed and over a Wholey wire exchanged for a 9 [place], 10 cm vascular sheath. In coaxial fashion, the Wholey wire, Navicross catheter, and 7 [place], 65 cm sheath were inserted and directed  to the level of the superior vena cava. The coaxial system was able to engage the central aspect of the indwelling innominate vein stent. Catheter wire recanalization of the more peripheral stent was also unsuccessful, including using the back of the stiff glidewire for semi sharp recanalization attempt. Central venogram was performed which demonstrated patency of the central aspect of the innominate vein stent with brisk antegrade flow into the superior vena cava. Next, direct puncture of the indwelling subclavian vein stent was then attempted under direct fluoroscopic and ultrasound visualization. A 0.018 inch Nitrex wire was inserted and coiled within the peripheral aspect of the stent, unable to advance any farther in meeting much resistance. The needle and wire were then removed. Finally, from the upper extremity approach, additional catheter wire recanalization including using the back of the stiff glidewire for similar sharp recanalization was attempted, also unsuccessful. The right brachial and right common femoral catheters and sheath were removed and hemostasis was achieved with manual compression. The indwelling basilic vein access was exchanged over a Nitrex wire for a 5 French, dual-lumen, 15 cm PICC line with the tip positioned in the axillary vein (functionally a midline). A sterile bandage was applied. The patient tolerated the procedure well was transferred back to the floor in stable condition. 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 IMPRESSION: 1. Right upper extremity and central venogram significant for complete occlusion of the indwelling right subclavian and innominate vein stents. 2. Unsuccessful recanalization of occluded right subclavian and innominate vein stents. 3. Placement of a right upper extremity midline catheter for inpatient intravenous access. Ruthann Cancer, MD [facility] Radiology  Electronically Signed   By: Ruthann Cancer M.D.   On: 07/21/2021 09:40   IR PICC PLACEMENT RIGHT <5 YRS INC IMG GUIDE  Result Date: 07/21/2021 INDICATION: 56 year old male with history of end-stage renal disease status post multiple upper extremity vascular access point for hemodialysis including right upper extremity fistula requiring right subclavian innominate vein stent placement years ago at an outside facility, left upper extremity HERO graft, and multiple prior tunneled hemodialysis catheters. The patient is now status post renal transplant with a functioning graft. The patient has experienced right upper extremity swelling for years which is progressively worsened, now with extreme right forearm and hand pain with worsening swelling. Recent CT imaging demonstrates occlusion of the indwelling right subclavian innominate vein stents, with suspected etiology of right upper extremity swelling attributed to worsening venous thoracic outlet syndrome. Due to iodinated contrast allergy, effort to protect renal graft, and inability to obtain intravenous access, formal CT venogram evaluation is unable to be obtained. Therefore, the patient presents for right upper extremity venogram with possible central venous recanalization. EXAM: 1. Ultrasound-guided vascular access of the right basilic vein. 2. Ultrasound-guided vascular access of the right brachial vein. 3. Ultrasound-guided vascular access of the right common femoral vein. 4. Right upper extremity venogram. 5. Central venogram. 6. Right upper extremity midline placement. COMPARISON:  None. MEDICATIONS: 50 mg Benadryl, intravenous ANESTHESIA/SEDATION: Versed 2.5 mg IV; Fentanyl 125 mcg IV Moderate Sedation Time:  51 The patient was continuously monitored during the procedure by the interventional radiology nurse under my direct supervision. FLUOROSCOPY TIME:  Fluoroscopy Time: 12.36 minutes (186 mGy). COMPLICATIONS: None immediate. TECHNIQUE: Informed  written  consent was obtained from the patient after a thorough discussion of the procedural risks, benefits and alternatives. All questions were addressed. Maximal Sterile Barrier Technique was utilized including caps, mask, sterile gowns, sterile gloves, sterile drape, hand hygiene and skin antiseptic. A timeout was performed prior to the initiation of the procedure. The right upper extremity and right groin were prepped and draped in standard fashion. The right basilic vein was evaluated with ultrasound which demonstrated compressible, free of internal echoes. Venous access was planned. Subdermal Local anesthesia was provided with 1% lidocaine. A small skin nick was made. Under direct ultrasound visualization, the right basilic vein was accessed with a 21 gauge micropuncture needle. A permanent image was captured and stored in the record. Micropuncture sheath was then inserted which aspirated and flushed appropriately. The sheath was then connected to tubing that was used for intravenous access/sedation for the procedure. Next, the right brachial vein was evaluated with ultrasound which demonstrated compressible, free of internal echoes. Venous access was planned. Subdermal Local anesthesia was provided with 1% lidocaine. A small skin nick was made. Under direct ultrasound visualization, the right brachial vein was accessed with a 21 gauge micropuncture needle. A permanent image was captured and stored in the record. Micropuncture set was inserted and a Wholey wire was directed under fluoroscopic guidance centrally. The wire was unable to engage in the indwelling subclavian vein stent. 0 dilation was performed and a 6 French sheath was placed. CO2 venogram of the right upper extremity was performed. This demonstrated patency of the brachial vein to the level of the axillary vein. The indwelling stents were totally occluded. There are multiple cervical and chest wall collateral veins present. A stiff Glidewire and  Navicross catheter were inserted in attempts to recanalized the occluded stent. The sheath was advanced the level of the axillary vein. Additional venogram was performed with a small amount of diluted iodinated contrast which confirmed complete occlusion of the indwelling stent with multiple collateral veins present. Catheter and wire recanalization was unsuccessful. Therefore, the right common femoral vein was evaluated with ultrasound which demonstrated compressible, free of internal echoes. Venous access was planned. Subdermal Local anesthesia was provided with 1% lidocaine. A small skin nick was made. Under direct ultrasound visualization, the right common femoral vein was accessed with a 21 gauge micropuncture needle. A permanent image was captured and stored in the record. Micropuncture sheath was placed and over a Wholey wire exchanged for a 9 [place], 10 cm vascular sheath. In coaxial fashion, the Wholey wire, Navicross catheter, and 7 [place], 65 cm sheath were inserted and directed to the level of the superior vena cava. The coaxial system was able to engage the central aspect of the indwelling innominate vein stent. Catheter wire recanalization of the more peripheral stent was also unsuccessful, including using the back of the stiff glidewire for semi sharp recanalization attempt. Central venogram was performed which demonstrated patency of the central aspect of the innominate vein stent with brisk antegrade flow into the superior vena cava. Next, direct puncture of the indwelling subclavian vein stent was then attempted under direct fluoroscopic and ultrasound visualization. A 0.018 inch Nitrex wire was inserted and coiled within the peripheral aspect of the stent, unable to advance any farther in meeting much resistance. The needle and wire were then removed. Finally, from the upper extremity approach, additional catheter wire recanalization including using the back of the stiff glidewire for similar  sharp recanalization was attempted, also unsuccessful. The right brachial and right common  femoral catheters and sheath were removed and hemostasis was achieved with manual compression. The indwelling basilic vein access was exchanged over a Nitrex wire for a 5 French, dual-lumen, 15 cm PICC line with the tip positioned in the axillary vein (functionally a midline). A sterile bandage was applied. The patient tolerated the procedure well was transferred back to the floor in stable condition. 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 IMPRESSION: 1. Right upper extremity and central venogram significant for complete occlusion of the indwelling right subclavian and innominate vein stents. 2. Unsuccessful recanalization of occluded right subclavian and innominate vein stents. 3. Placement of a right upper extremity midline catheter for inpatient intravenous access. Ruthann Cancer, MD [facility] Radiology Electronically Signed   By: Ruthann Cancer M.D.   On: 07/21/2021 09:40     PERTINENT LAB RESULTS: CBC: Recent Labs    07/20/21 0305 07/21/21 0637  WBC 6.2 10.5  HGB 14.9 14.7  HCT 44.4 44.5  PLT 169 182   CMET CMP     Component Value Date/Time   NA 139 07/21/2021 0637   K 4.3 07/21/2021 0637   CL 105 07/21/2021 0637   CO2 24 07/21/2021 0637   GLUCOSE 161 (H) 07/21/2021 0637   BUN 31 (H) 07/21/2021 0637   CREATININE 1.61 (H) 07/21/2021 0637   CALCIUM 9.6 07/21/2021 0637   PROT 8.8 (H) 12/03/2013 0900   ALBUMIN 3.2 (L) 07/21/2021 0637   AST 27 12/03/2013 0900   ALT 18 12/03/2013 0900   ALKPHOS 115 12/03/2013 0900   BILITOT 0.4 12/03/2013 0900   GFRNONAA 50 (L) 07/21/2021 0637   GFRAA 3 (L) 05/07/2018 0630    GFR Estimated Creatinine Clearance: 63.7 mL/min (A) (by C-G formula based on SCr of 1.61 mg/dL (H)). No results for input(s): LIPASE, AMYLASE in the last 72 hours. No results for input(s):  CKTOTAL, CKMB, CKMBINDEX, TROPONINI in the last 72 hours. Invalid input(s): POCBNP No results for input(s): DDIMER in the last 72 hours. Recent Labs    07/20/21 0305  HGBA1C 5.3   No results for input(s): CHOL, HDL, LDLCALC, TRIG, CHOLHDL, LDLDIRECT in the last 72 hours. No results for input(s): TSH, T4TOTAL, T3FREE, THYROIDAB in the last 72 hours.  Invalid input(s): FREET3 No results for input(s): VITAMINB12, FOLATE, FERRITIN, TIBC, IRON, RETICCTPCT in the last 72 hours. Coags: No results for input(s): INR in the last 72 hours.  Invalid input(s): PT Microbiology: Recent Results (from the past 240 hour(s))  Resp Panel by RT-PCR (Flu A&B, Covid) Nasopharyngeal Swab     Status: None   Collection Time: 07/18/21  5:29 PM   Specimen: Nasopharyngeal Swab; Nasopharyngeal(NP) swabs in vial transport medium  Result Value Ref Range Status   SARS Coronavirus 2 by RT PCR NEGATIVE NEGATIVE Final    Comment: (NOTE) SARS-CoV-2 target nucleic acids are NOT DETECTED.  The SARS-CoV-2 RNA is generally detectable in upper respiratory specimens during the acute phase of infection. The lowest concentration of SARS-CoV-2 viral copies this assay can detect is 138 copies/mL. A negative result does not preclude SARS-Cov-2 infection and should not be used as the sole basis for treatment or other patient management decisions. A negative result may occur with  improper specimen collection/handling, submission of specimen other than nasopharyngeal swab, presence of viral mutation(s) within the areas targeted by this assay, and inadequate number of viral copies(<138 copies/mL). A negative result must be combined with clinical observations, patient history, and epidemiological information. The  expected result is Negative.  Fact Sheet for Patients:  EntrepreneurPulse.com.au  Fact Sheet for Healthcare Providers:  IncredibleEmployment.be  This test is no t yet approved  or cleared by the [place] FDA and  has been authorized for detection and/or diagnosis of SARS-CoV-2 by FDA under an Emergency Use Authorization (EUA). This EUA will remain  in effect (meaning this test can be used) for the duration of the COVID-19 declaration under Section 564(b)(1) of the Act, 21 U.S.C.section 360bbb-3(b)(1), unless the authorization is terminated  or revoked sooner.       Influenza A by PCR NEGATIVE NEGATIVE Final   Influenza B by PCR NEGATIVE NEGATIVE Final    Comment: (NOTE) The Xpert Xpress SARS-CoV-2/FLU/RSV plus assay is intended as an aid in the diagnosis of influenza from Nasopharyngeal swab specimens and should not be used as a sole basis for treatment. Nasal washings and aspirates are unacceptable for Xpert Xpress SARS-CoV-2/FLU/RSV testing.  Fact Sheet for Patients: EntrepreneurPulse.com.au  Fact Sheet for Healthcare Providers: IncredibleEmployment.be  This test is not yet approved or cleared by the [place] FDA and has been authorized for detection and/or diagnosis of SARS-CoV-2 by FDA under an Emergency Use Authorization (EUA). This EUA will remain in effect (meaning this test can be used) for the duration of the COVID-19 declaration under Section 564(b)(1) of the Act, 21 U.S.C. section 360bbb-3(b)(1), unless the authorization is terminated or revoked.  Performed at [facility], [address] [place], [place] [address]     FURTHER DISCHARGE INSTRUCTIONS:  Get Medicines reviewed and adjusted: Please take all your medications with you for your next visit with your Primary MD  Laboratory/radiological data: Please request your Primary MD to go over all hospital tests and procedure/radiological results at the follow up, please ask your Primary MD to get all Hospital records sent to his/her office.  In some cases, they will be blood work, cultures and biopsy results pending at the time  of your discharge. Please request that your primary care M.D. goes through all the records of your hospital data and follows up on these results.  Also Note the following: If you experience worsening of your admission symptoms, develop shortness of breath, life threatening emergency, suicidal or homicidal thoughts you must seek medical attention immediately by calling 911 or calling your MD immediately  if symptoms less severe.  You must read complete instructions/literature along with all the possible adverse reactions/side effects for all the Medicines you take and that have been prescribed to you. Take any new Medicines after you have completely understood and accpet all the possible adverse reactions/side effects.   Do not drive when taking Pain medications or sleeping medications (Benzodaizepines)  Do not take more than prescribed Pain, Sleep and Anxiety Medications. It is not advisable to combine anxiety,sleep and pain medications without talking with your primary care practitioner  Special Instructions: If you have smoked or chewed Tobacco  in the last 2 yrs please stop smoking, stop any regular Alcohol  and or any Recreational drug use.  Wear Seat belts while driving.  Please note: You were cared for by a hospitalist during your hospital stay. Once you are discharged, your primary care physician will handle any further medical issues. Please note that NO REFILLS for any discharge medications will be authorized once you are discharged, as it is imperative that you return to your primary care physician (or establish a relationship with a primary care physician if you do not have one) for  your post hospital discharge needs so that they can reassess your need for medications and monitor your lab values.  Total Time spent coordinating discharge including counseling, education and face to face time equals 45 minutes.  Signed: Gerri Acre 07/21/2021 2:09 PM

## 2021-07-21 NOTE — Hospital Course (Addendum)
Patient is a 56 y.o.  male history of ESRD-s/p renal transplant in 2021-who has had worsening right upper extremity lymphedema since his transplant surgery-presenting with worsening right arm lymphedema.  See below for further details.   Significant Hospital events: 1/31>> admit to [facility] for worsening right upper extremity lymphedema.  Per H&P-has had gradually worsening lymphedema since renal transplant in 2021.  Significant imaging studies: 1/30>> right upper extremity Doppler: No DVT  Significant microbiology data: 1/30>> COVID/flu PCR: Negative  Procedures: 2/1>> 1) Right upper extremity venogram 2) Central venogram 3) Unsuccessful RUE venous stent recanalization 4) Midline placement  Consults:  Interventional radiology

## 2021-07-21 NOTE — Assessment & Plan Note (Signed)
Continue Neurontin. 

## 2021-07-21 NOTE — Assessment & Plan Note (Signed)
Resolved. Unclear etiology.  

## 2021-07-21 NOTE — Assessment & Plan Note (Signed)
Renal function appears stable-continue tacrolimus, prednisone and mycophenolate.

## 2021-07-21 NOTE — Assessment & Plan Note (Signed)
Continue statin. 

## 2021-07-21 NOTE — Assessment & Plan Note (Signed)
BP stable-continue amlodipine, Coreg

## 2021-07-21 NOTE — Assessment & Plan Note (Signed)
CBG stable on SSI-diet controlled in the outpatient setting.

## 2021-07-21 NOTE — Assessment & Plan Note (Addendum)
Lymphedema due to occlusion of indwelling right subclavian stent/innominate vein stent occlusion-IR performed angiogram on 2/1-unfortunately attempts to recanalize were unsuccessful.  Recommendations are for conservative measures including compression wraps/elevation to improve swelling.  At patient's request-vascular surgery was consulted over the phone, Dr. Sherral Hammers reviewed imaging/IR studies-and also discussed with IR MD.  Per Dr. Eliot Ford would not have done anything different than what IR has performed, he unfortunately has nothing further to offer in this case.  Since patient had a stent placed at [facility] asked him to see if he could seek another opinion there.  In the meantime-have asked nurse to apply compression dressing, I have educated patient regarding importance of elevation of the affected extremity.

## 2021-07-21 NOTE — TOC Transition Note (Signed)
Transition of Care [facility]) - CM/SW Discharge Note   Patient Details  Name: Nicholas Green MRN: [id number] Date of Birth: 1965/07/27  Transition of Care [facility] CM/SW Contact:  Harriet Masson, RN Phone Number: 07/21/2021, 3:17 PM   Clinical Narrative:     Patient stable for discharge.  Patient has transportation home. No needs at this time.          Patient Goals and CMS Choice    Return home    Discharge Placement             home          Discharge Plan and Services                 home                    Social Determinants of Health (SDOH) Interventions     Readmission Risk Interventions Readmission Risk Prevention Plan 07/21/2021  Post Dischage Appt Complete  Medication Screening Complete  Transportation Screening Complete  Some recent data might be hidden

## 2021-08-03 ENCOUNTER — Other Ambulatory Visit: Payer: Self-pay | Admitting: Interventional Radiology

## 2021-08-03 DIAGNOSIS — M7989 Other specified soft tissue disorders: Secondary | ICD-10-CM

## 2021-08-05 ENCOUNTER — Other Ambulatory Visit: Payer: Medicaid Other

## 2022-02-13 IMAGING — DX DG WRIST COMPLETE 3+V*R*
4 series · 4 of 4 positions shown · non-contrast
Comparison: CT 06/07/2020

CLINICAL DATA: right arm swelling/pain

EXAM:
RIGHT WRIST - COMPLETE 3+ VIEW

[wrist ap (1 of 2)]
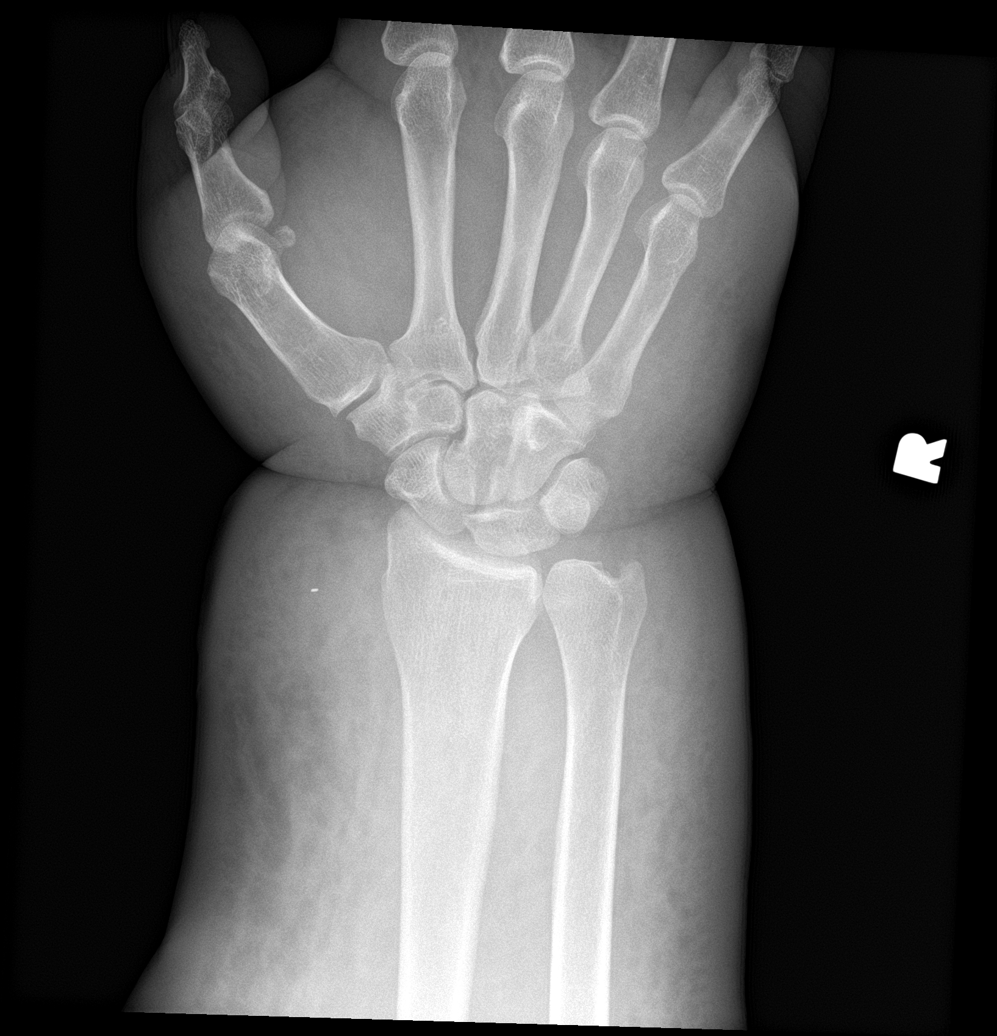

[wrist obl]
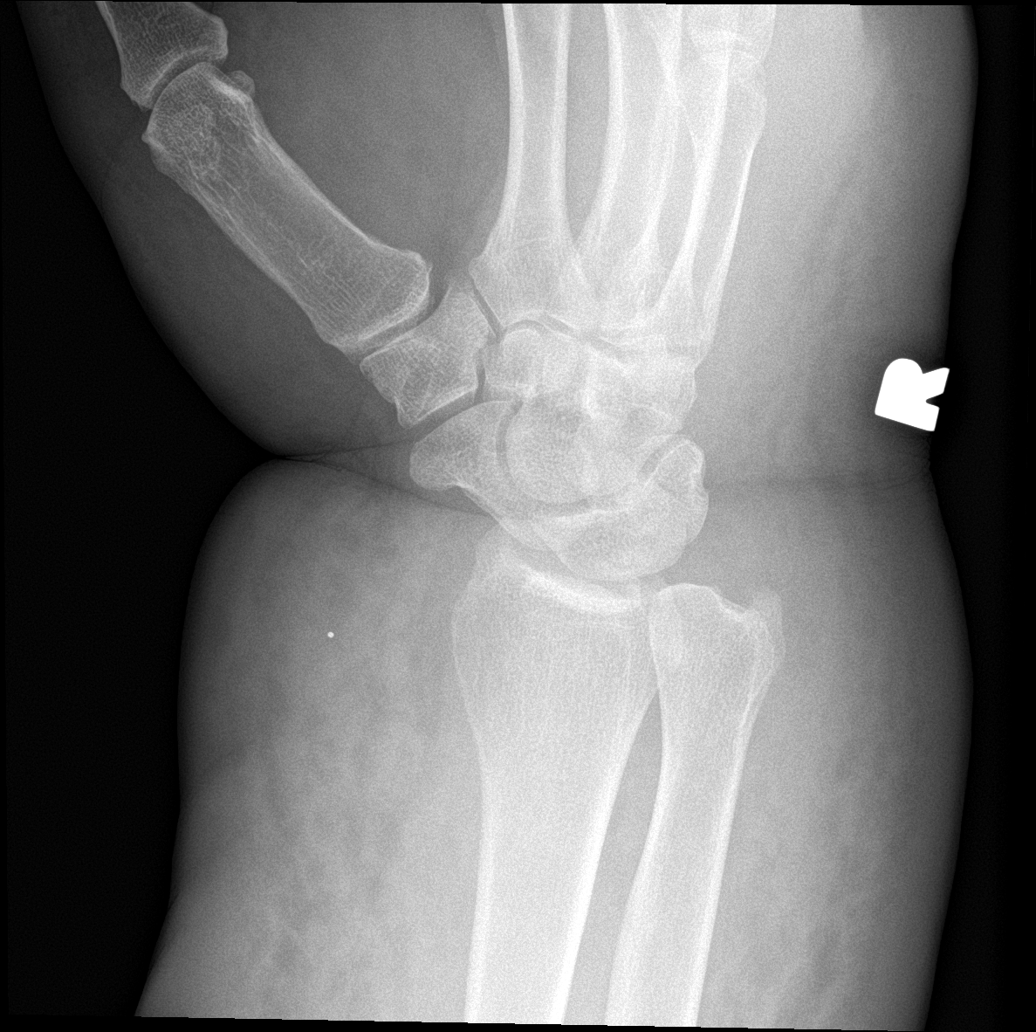

[wrist lat]
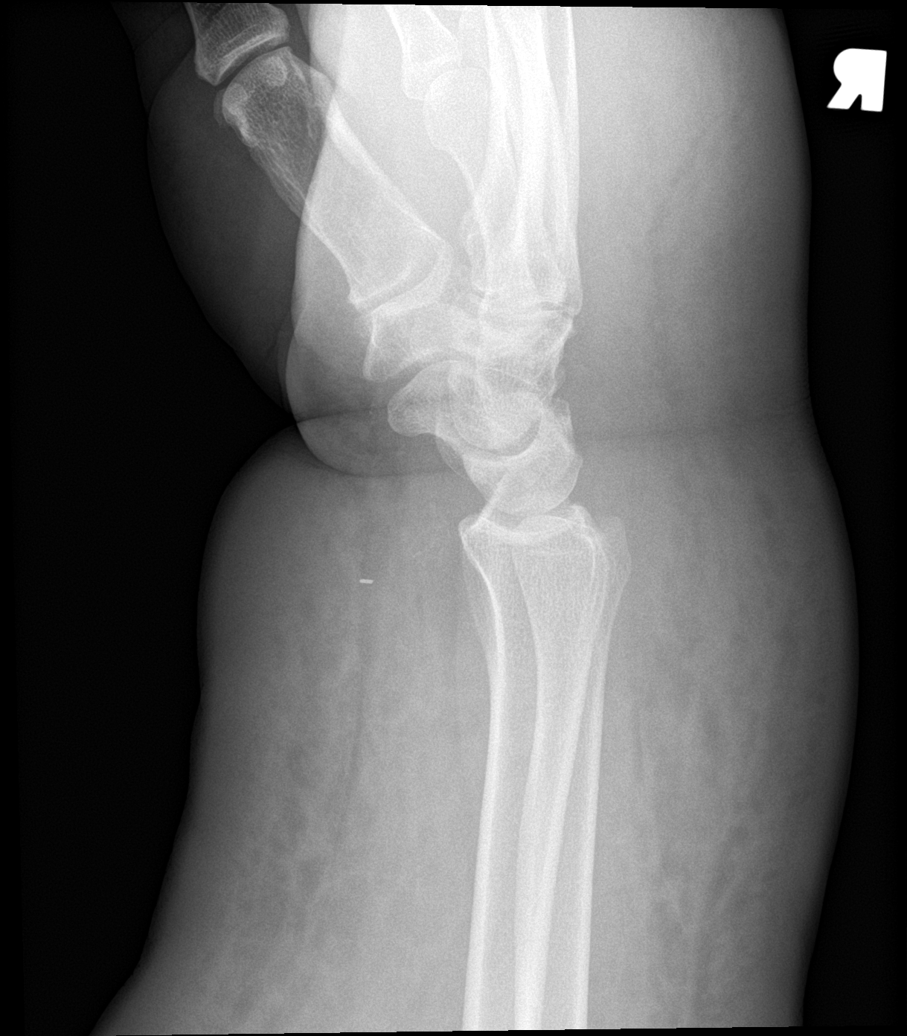

[wrist ap (2 of 2)]
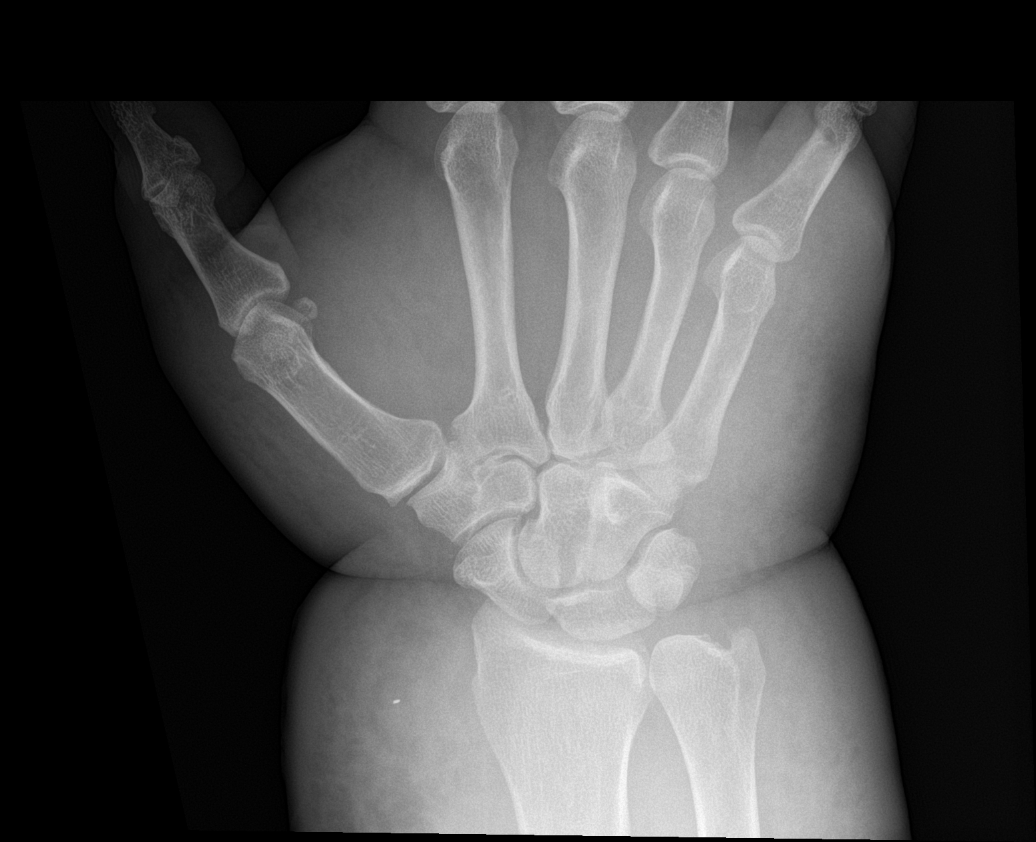

[4 of 4 positions shown; findings below may reference images not displayed]

FINDINGS: There is no evidence of fracture or dislocation. No bony erosive
change or focal osteopenia. Obliquity limits evaluation on the
lateral radiograph. Mild first CMC and triscaphe degenerative
change. Soft tissues edema. Small (3 mm) linear radiopaque foreign
body within the volar and radial soft tissues at the level of the
wrist.
IMPRESSION: 1. Soft tissue edema without evidence of acute osseous abnormality.
2. Small (3 mm) linear radiopaque foreign body within the volar and
radial soft tissues at the level of the wrist.
3. Mild first CMC and triscaphe degenerative change.

## 2022-02-13 IMAGING — US US EXTREM  UP VENOUS*R*
1 series · 13 of 24 positions shown · non-contrast
Comparison: CT chest from 06/08/2020, CT head and neck from
07/07/2021

CLINICAL DATA: 55-year-old male with right upper extremity
swelling.



[Series 1: us extrem up venous*right* · 13 of 28 slices shown]
[im 1/28]
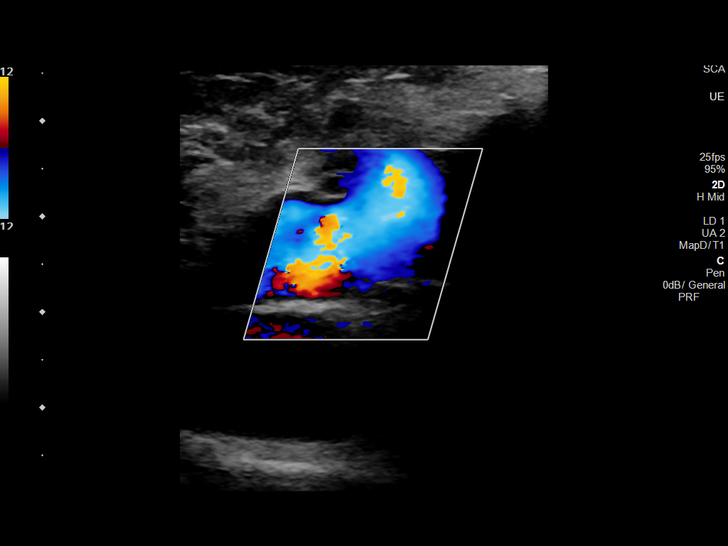
[im 3/28]
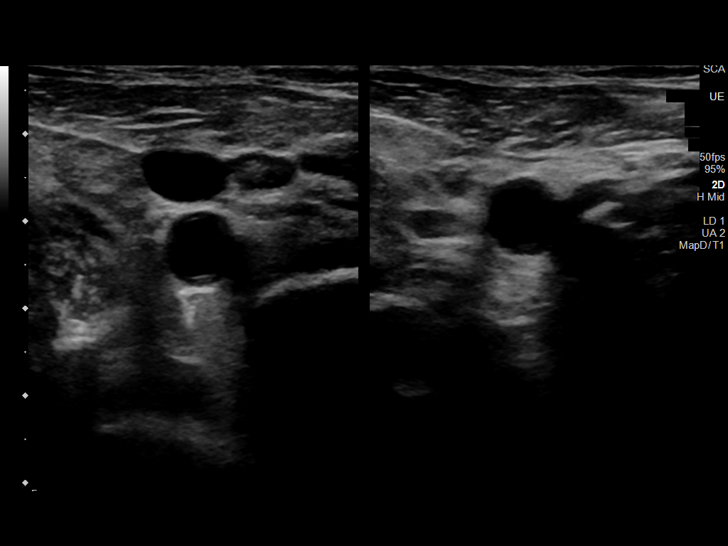
[im 5/28]
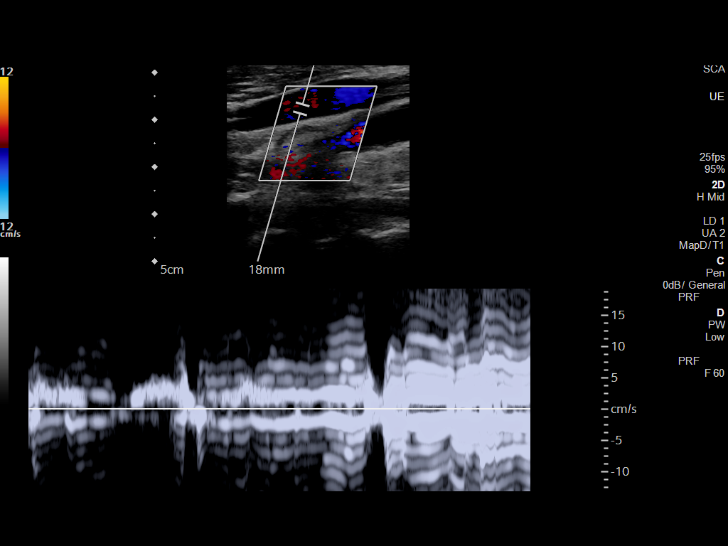
[im 8/28]
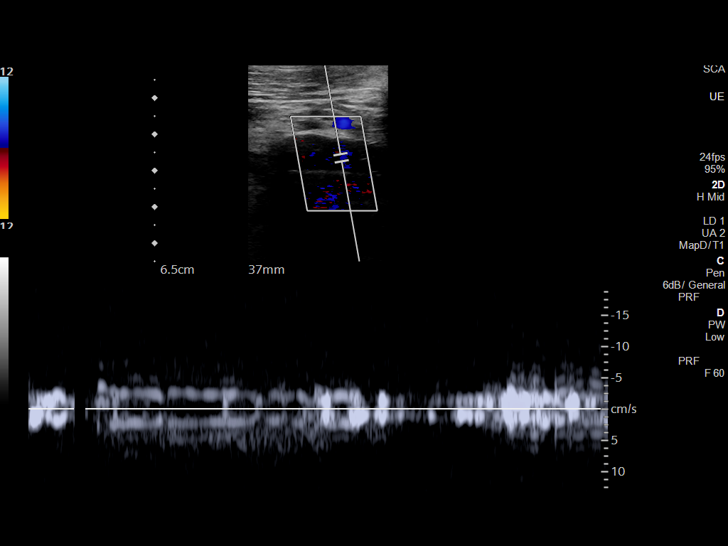
[im 10/28]
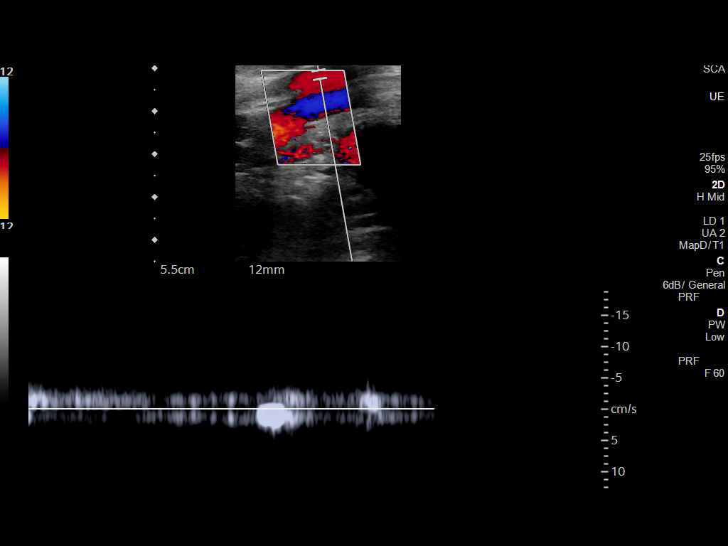
[im 12/28]
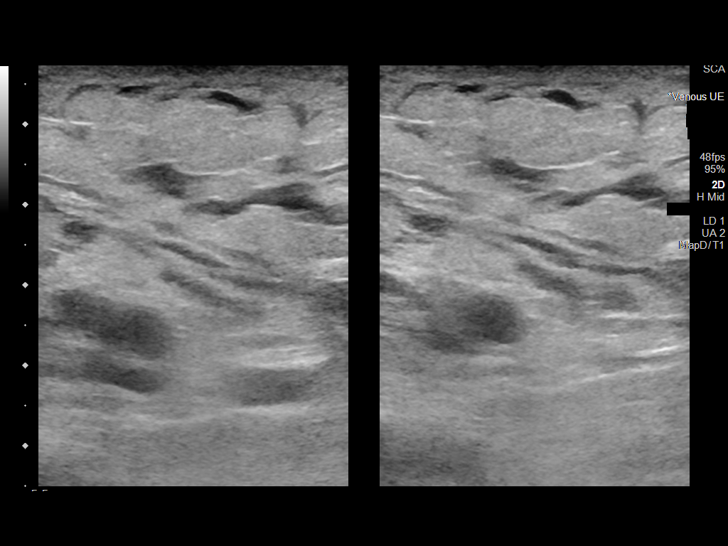
[im 15/28]
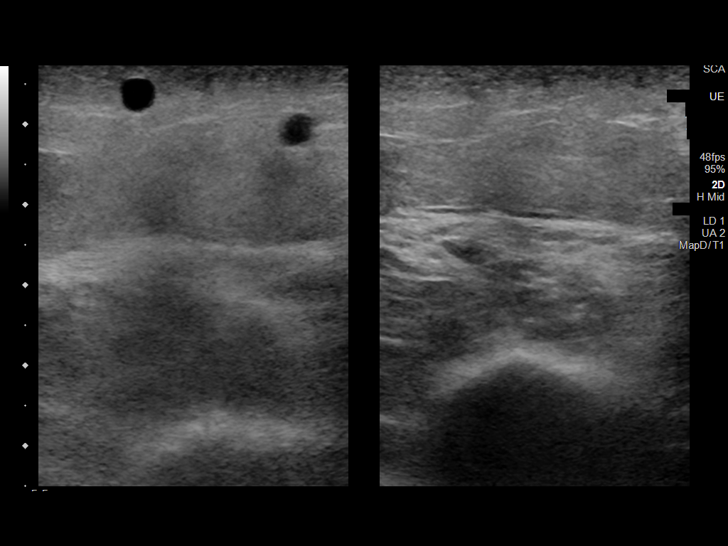
[im 16/28]
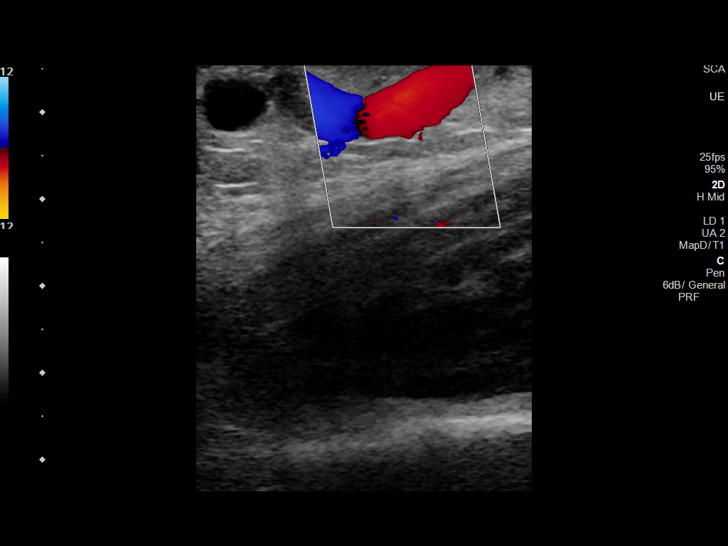
[im 18/28]
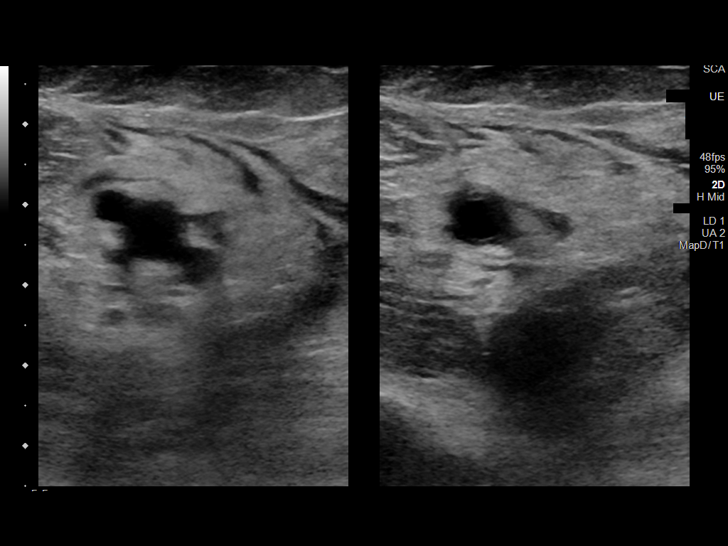
[im 20/28]
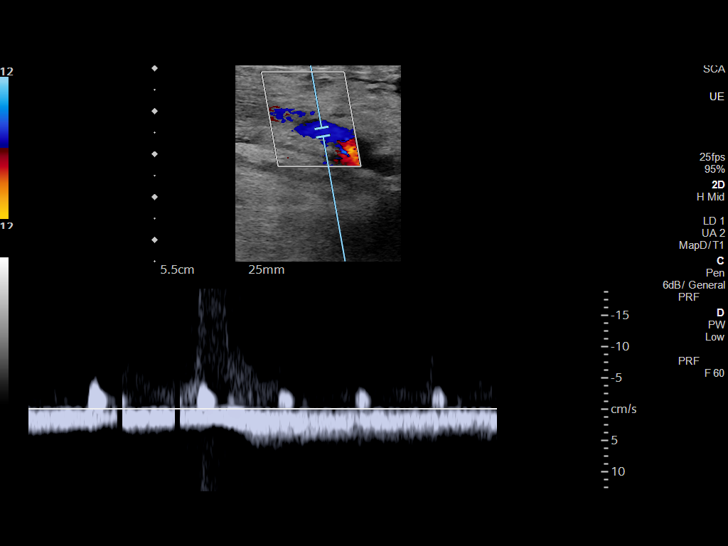
[im 23/28]
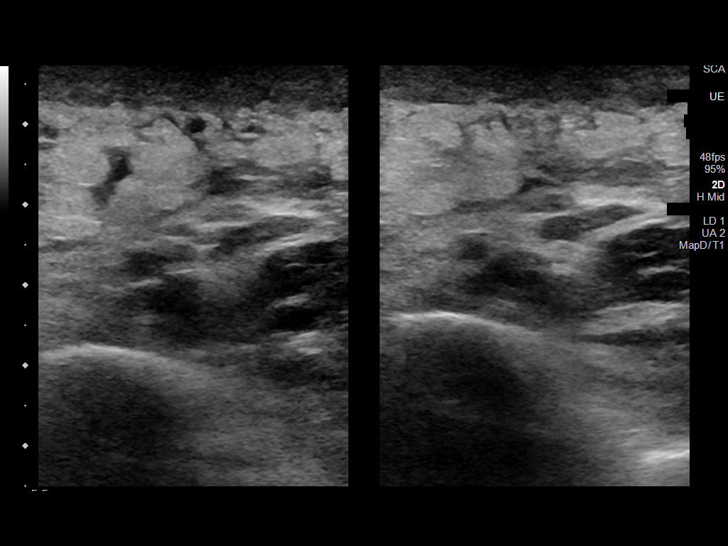
[im 25/28]
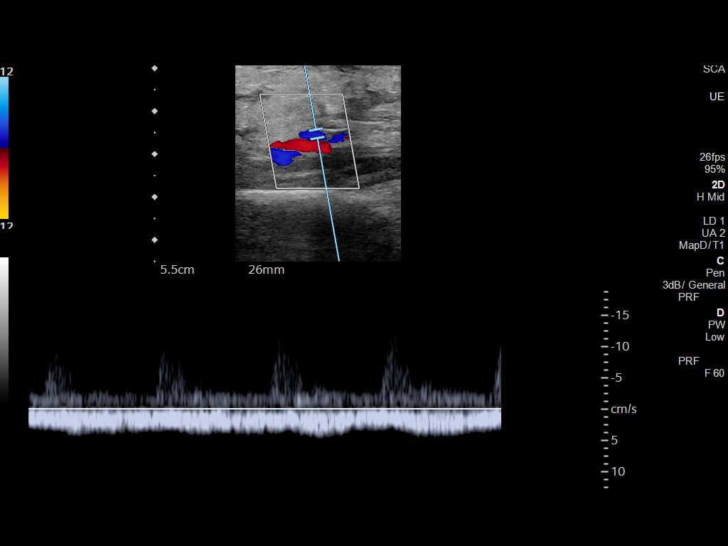
[im 28/28]
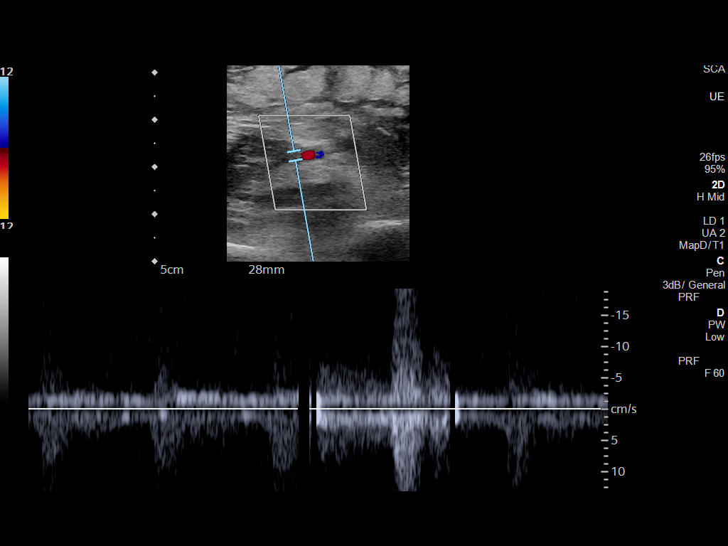

[13 of 24 positions shown; findings below may reference images not displayed]

FINDINGS: Contralateral Subclavian Vein: Respiratory phasicity is normal and
symmetric with the symptomatic side. No evidence of thrombus. Normal
compressibility.

Internal Jugular Vein: No evidence of thrombus. Normal
compressibility, respiratory phasicity and response to augmentation.

Subclavian Vein: No evidence of thrombus. Absence of respiratory
phasicity.

Axillary Vein: No evidence of thrombus. Absence of respiratory
phasicity.

Cephalic Vein: No evidence of thrombus. Absence of respiratory
phasicity. Normal compressibility.

Basilic Vein: No evidence of thrombus. Absence of respiratory
phasicity. Normal compressibility.

Brachial Veins: No evidence of thrombus. Absence of respiratory
phasicity. Normal compressibility.

Radial Veins: No evidence of thrombus. Normal compressibility,
respiratory phasicity and response to augmentation.

Ulnar Veins: No evidence of thrombus. Normal compressibility,
respiratory phasicity and response to augmentation.

Other Findings:  Diffuse right upper extremity subcutaneous edema.
IMPRESSION: No evidence of right upper extremity deep vein thrombosis. Lack of
respiratory phasicity throughout the central upper extremity veins
is suggestive of central venous occlusion, likely from occlusion of
indwelling right innominate/subclavian vein stents.

Consider CT venogram chest for further characterization and referral
to Interventional Radiology or other qualified endovascular
specialist.

These results were called by telephone at the time of interpretation
on 07/18/2021 at [DATE] to provider DLGASH TOM , who verbally
acknowledged these results.

## 2023-01-18 NOTE — Congregational Nurse Program (Signed)
  Dept: [phone]   Congregational Nurse Program Note  Date of Encounter: 01/18/2023 No vital signs today. Client requested an ankle blood pressure during visit inside of [employer] [facility], reported he takes his ankle blood pressure at home with electronic blood pressure monitor, and providers take his blood pressure at his ankle. Client reported kidney transplant in 2021 and doing well.  Client reported he has Urology appointment tomorrow 01-19-23 in [place], [place], on September 12, and at [facility] October 2024. Nurse observed entire right arm swollen, client reported he has lymphedema in right upper arm and a fistula in left arm. Nurse unable to palpate a left ankle tibial pulse, attempted ankle pressure with electronic blood pressure cuff and monitor read "error". Nurse unable to obtain left ankle blood pressure manually  with palpable pedal pulse. No wrist BP cuffs available, patient observed obese, wearing long pants, no privacy for screening a thigh BP on client.  Client reported today he doesn't have dizziness, headache, shortness of breath, regular nosebleeds, blurry vision or current symptoms of concern, nurse instructed client to report symptoms of concern to provider. Client verbalized understanding. Instructed client to ask provider tomorrow am 01-19-23 appointment what numbers on his blood pressure machine at home, would indicate need to call a provider. Client reported understanding. Julianne Handler RN [facility] [employer] [facility] [phone]   Past Medical History: Past Medical History:  Diagnosis Date   Chronic renal failure    S/P renal transplant   Condyloma    meatal condyloma    Dialysis patient [facility])    reports he gets dialysis every evening ; has graft in left upper extremity, shunt in right forearm and also has peritoneal access   Gout    Hypertension    Reflux    S/P kidney transplant    Stroke [facility])    SVT  (supraventricular tachycardia) (HCC)    SEE ED VISIT EPIC CARE EVERYWHERE 03-06-18 ; PATIENT HAD LOW PAOTASSIUM LEVELS, CARDIO CONSULT DONE AND UNDERWENT STRESS TEST AND ECHO . DENIES RECURENCE OF SX AT TODAY PRE-OP 04-29-18     Encounter Details:  CNP Questionnaire - 01/18/23 1222       Questionnaire   Ask client: Do you give verbal consent for me to treat you today? Yes    Student Assistance N/A    Location Patient Served  [facility] [place]    Visit Setting with Client [facility] Zenaida Niece    Patient Status Unknown    Insurance Medicaid    Insurance/Financial Assistance Referral Medicaid    Medication N/A   Social security disability   Medical Provider Yes    Screening Referrals Made N/A    Medical Referrals Made N/A    Medical Appointment Made N/A    Recently w/o PCP, now 1st time PCP visit completed due to CNs referral or appointment made N/A    Food Have Food Insecurities    Transportation N/A    Housing/Utilities N/A    Interpersonal Safety N/A    Interventions Advocate/Support;Educate;Case Management    Abnormal to Normal Screening Since Last CN Visit N/A    Screenings CN Performed N/A    Sent Client to Lab for: N/A    Did client attend any of the following based off CNs referral or appointments made? N/A    ED Visit Averted N/A    Life-Saving Intervention Made N/A

## 2023-10-29 DIAGNOSIS — R000 Tachycardia, unspecified: Secondary | ICD-10-CM | POA: Diagnosis not present

## 2024-03-11 ENCOUNTER — Telehealth: Payer: Self-pay | Admitting: Internal Medicine

## 2024-03-11 NOTE — Telephone Encounter (Signed)
 Nicholas Green called in to be scheduled. I informed Nicholas Green that Nicholas Green needs to fax his referral to us  along with his records so that we can get him scheduled. Nicholas Green stated that he just left the office and will call back to have his referral sent to us . I also gave Nicholas Green the option too bring his referral to the office if he would like.

## 2024-05-08 ENCOUNTER — Inpatient Hospital Stay: Admitting: Hematology and Oncology

## 2024-05-08 ENCOUNTER — Inpatient Hospital Stay

## 2024-05-12 NOTE — Progress Notes (Signed)
 [facility] [phone]   [phone]  INITIAL CONSULT NOTE  Patient Care Team: Catalina Bare, MD as PCP - General (Internal Medicine)  Hematological/Oncological History 05/22/2023: WBC 4.2, Hgb 15.6, MCV 92.4, Plt 142K, Hepatitis panel non-reactive, creatinine 2.74 (H), PTH 238 (H) 03/06/2024: WBC 3. 7(L), Hgb 11.6 (L), MCV 102.3 (H), Plt 130 (L), ALC 633 (L), AEC 11 (L), Folate 20.6, Vitamin B12 615 03/27/2024: WBC 5.5, Hgb 11.7 (L), MCV 100.8 (H), Plt 145K, ALC 737 (L), AEC 11 (L), Vitamin B12 876, Folate 13.5 05/13/2024: Establish care with [facility]   CHIEF COMPLAINTS/PURPOSE OF CONSULTATION:  Pancytopenia  HISTORY OF PRESENTING ILLNESS:  JODIE LEINER 58 y.o. 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 He is unaccompanied for this visit.  On exam today, Mr. Beckel reports that he does have chronic fatigue that is worse on days he has dialysis. He continues to complete his ADLs on his own and require resting periodically. He has a great appetite and denies any weight changes. He denies nausea, vomiting or bowel habit changes. He denies easy bruising or signs of active bleeding including hematochezia or melena. He denies fevers, chills, sweats, shortness of breath, chest pain or cough. He has no other complaints. Rest of the ROS is below.   MEDICAL HISTORY:  Past Medical History:  Diagnosis Date   Chronic renal failure    S/P renal transplant   Condyloma    meatal condyloma    Dialysis patient    reports he gets dialysis every evening ; has graft in left upper extremity, shunt in right forearm and also has peritoneal access   Gout    Hypertension    Reflux    S/P kidney transplant    Stroke [facility])    SVT (supraventricular tachycardia)    SEE ED VISIT EPIC CARE EVERYWHERE 03-06-18 ; PATIENT HAD LOW  PAOTASSIUM LEVELS, CARDIO CONSULT DONE AND UNDERWENT STRESS TEST AND ECHO . DENIES RECURENCE OF SX AT TODAY PRE-OP 04-29-18     SURGICAL HISTORY: Past Surgical History:  Procedure Laterality Date   AV FISTULA PLACEMENT     left arm   CYSTOSCOPY WITH BIOPSY N/A 05/07/2018   Procedure: CYSTOSCOPY WITH EXCISIONAL BIOPSY OF PENILE CONDYLOMA;  Surgeon: Watt Rush, MD;  Location: [facility];  Service: Urology;  Laterality: N/A;   HERNIA REPAIR     IR US  GUIDE VASC ACCESS RIGHT  07/20/2021   IR US  GUIDE VASC ACCESS RIGHT  07/20/2021   IR US  GUIDE VASC ACCESS RIGHT  07/20/2021   IR VENOCAVAGRAM SVC  07/20/2021   KIDNEY TRANSPLANT Right 2008   NASAL FRACTURE SURGERY      SOCIAL HISTORY: Social History   Socioeconomic History   Marital status: Legally Separated    Spouse name: Not on file   Number of children: Not on file   Years of education: Not on file   Highest education level: Not on file  Occupational History   Not on file  Tobacco Use   Smoking status: Former    Types: Cigarettes   Smokeless tobacco: Never  Vaping Use   Vaping status: Never Used  Substance and Sexual Activity   Alcohol use: No   Drug use: No   Sexual activity: Not on file  Other Topics Concern   Not on file  Social History Narrative   Not on file  Social Drivers of Corporate Investment Banker Strain: Not on File (09/07/2021)   Received from [employer]    Financial Resource Strain: 0  Food Insecurity: Food Insecurity Present (05/13/2024)   Hunger Vital Sign    Worried About Running Out of Food in the Last Year: Never true    Ran Out of Food in the Last Year: Sometimes true  Transportation Needs: No Transportation Needs (05/13/2024)   PRAPARE - Administrator, Civil Service (Medical): No    Lack of Transportation (Non-Medical): No  Physical Activity: Not on File (09/07/2021)   Received from [facility]   Physical Activity    Physical Activity: 0  Stress: Not on File  (09/07/2021)   Received from [facility]   Stress    Stress: 0  Social Connections: Not on File (03/03/2023)   Received from [facility]   Social Connections    Connectedness: 0  Intimate Partner Violence: Not At Risk (05/13/2024)   Humiliation, Afraid, Rape, and Kick questionnaire    Fear of Current or Ex-Partner: No    Emotionally Abused: No    Physically Abused: No    Sexually Abused: No    FAMILY HISTORY: History reviewed. No pertinent family history.  ALLERGIES:  is allergic to ivp dye [iodinated contrast media] and metrizamide.  MEDICATIONS:  Current Outpatient Medications  Medication Sig Dispense Refill   omeprazole (PRILOSEC) 40 MG capsule Take 40 mg by mouth daily.     oxycodone  (ROXICODONE ) 30 MG immediate release tablet Take 10 mg by mouth every 4 (four) hours as needed for pain.     predniSONE  (DELTASONE ) 5 MG tablet Take 5 mg by mouth daily with breakfast.     tacrolimus  ER (ENVARSUS  XR) 4 MG TB24 Take 12 mg by mouth daily. 3 tablets daily = 12 mg     Vitamin D, Ergocalciferol, (DRISDOL) 1.25 MG (50000 UNIT) CAPS capsule Take 50,000 Units by mouth every 30 (thirty) days.     amLODipine  (NORVASC ) 10 MG tablet Take 10 mg by mouth daily.     aspirin  81 MG chewable tablet Chew 81 mg by mouth daily.     atorvastatin  (LIPITOR) 10 MG tablet Take 10 mg by mouth daily.     carvedilol  (COREG ) 12.5 MG tablet Take 12.5 mg by mouth 2 (two) times daily with a meal.     ENVARSUS  XR 1 MG TB24 Take 1 mg by mouth daily.     gabapentin  (NEURONTIN ) 100 MG capsule Take 100 mg by mouth at bedtime.     gabapentin  (NEURONTIN ) 300 MG capsule Take 300 mg by mouth 3 (three) times daily.     magnesium  oxide (MAG-OX) 400 MG tablet Take 800 mg by mouth 2 (two) times daily. 2 tabs oral twice a day     mycophenolate  (MYFORTIC ) 360 MG TBEC EC tablet Take 720 mg by mouth 2 (two) times daily.     No current facility-administered medications for this visit.    REVIEW OF SYSTEMS:   Constitutional: ( - ) fevers,  ( - )  chills , ( - ) night sweats Eyes: ( - ) blurriness of vision, ( - ) double vision, ( - ) watery eyes Ears, nose, mouth, throat, and face: ( - ) mucositis, ( - ) sore throat Respiratory: ( - ) cough, ( - ) dyspnea, ( - ) wheezes Cardiovascular: ( - ) palpitation, ( - ) chest discomfort, ( - ) lower extremity swelling Gastrointestinal:  ( - )  nausea, ( - ) heartburn, ( - ) change in bowel habits Skin: ( - ) abnormal skin rashes Lymphatics: ( - ) new lymphadenopathy, ( - ) easy bruising Neurological: ( - ) numbness, ( - ) tingling, ( - ) new weaknesses Behavioral/Psych: ( - ) mood change, ( - ) new changes  All other systems were reviewed with the patient and are negative.  PHYSICAL EXAMINATION: ECOG PERFORMANCE STATUS: 1 - Symptomatic but completely ambulatory  Vitals:   05/13/24 0924  BP: 126/66  Pulse: 82  Resp: 14  Temp: 98.3 F (36.8 C)  SpO2: 96%   Filed Weights   05/13/24 0924  Weight: 207 lb 9.6 oz (94.2 kg)    GENERAL: well appearing mela in NAD  SKIN: skin color, texture, turgor are normal, no rashes or significant lesions EYES: conjunctiva are pink and non-injected, sclera clear OROPHARYNX: no exudate, no erythema; lips, buccal mucosa, and tongue normal  NECK: supple, non-tender LUNGS: clear to auscultation and percussion with normal breathing effort HEART: regular rate & rhythm and no murmurs and no lower extremity edema Musculoskeletal: no cyanosis of digits and no clubbing. Right upper extremity lymphedema. PSYCH: alert & oriented x 3, fluent speech NEURO: no focal motor/sensory deficits  LABORATORY DATA:  I have reviewed the data as listed    Latest Ref Rng & Units 07/21/2021    6:37 AM 07/20/2021    3:05 AM 07/18/2021    5:44 PM  CBC  WBC 4.0 - 10.5 K/uL 10.5  6.2  5.9   Hemoglobin 13.0 - 17.0 g/dL 85.2  85.0  85.7   Hematocrit 39.0 - 52.0 % 44.5  44.4  43.3   Platelets 150 - 400 K/uL 182  169  145        Latest Ref Rng & Units 07/21/2021    6:37  AM 07/20/2021    3:05 AM 07/19/2021   12:09 PM  CMP  Glucose 70 - 99 mg/dL 838  848  883   BUN 6 - 20 mg/dL 31  25  17    Creatinine 0.61 - 1.24 mg/dL 8.38  8.39  8.58   Sodium 135 - 145 mmol/L 139  136  139   Potassium 3.5 - 5.1 mmol/L 4.3  4.5  4.1   Chloride 98 - 111 mmol/L 105  105  108   CO2 22 - 32 mmol/L 24  21  23    Calcium  8.9 - 10.3 mg/dL 9.6  9.5  9.4    RADIOGRAPHIC STUDIES: I have personally reviewed the radiological images as listed and agreed with the findings in the report. No results found.  ASSESSMENT & PLAN ZEBULON GANTT is a 58 y.o. male who presents to the clinic for evaluation of pancytopenia.   #Pancytopenia: --Etiology unknown but differentials include chronic renal disease s/p kidney transplant while on immunosuppressants.  --Labs today to check CBC, CMP, LDH, retic panel, SPEP/IFE, serum free light chains, erythropoietin, copper  --Outside labs from 03/2024 shows no evidence of vitamin B12 or folate deficiencies.  --RTC in 6 months unless above workup requires additional intervention.   #H/O DVT of femoral vein of RLE: --S/thrombectomy in May 2025. Currently on Eliquis therapy  #Right upper extremity lymphedema: --Developed after kidney transplant in 2021.  #Renal disease s/p kidney transplant: --S/p kidney transplant in 2008 and 2021 --Currently receives dialysis 3x/week --Currently on immunosuppressants with tacrolimus , mycophenolate  and prednisone  --Continue to follow up with nephrologist.   Orders Placed This Encounter  Procedures   CBC with  Differential (Cancer Center Only)    Standing Status:   Future    Number of Occurrences:   1    Expiration Date:   05/13/2025   CMP (Cancer Center only)    Standing Status:   Future    Number of Occurrences:   1    Expiration Date:   05/13/2025   Technologist smear review    Clinical information::   macrocytic anemia   Multiple Myeloma Panel (SPEP&IFE w/QIG)    Standing Status:   Future    Number of  Occurrences:   1    Expiration Date:   05/13/2025   Kappa/lambda light chains    Standing Status:   Future    Number of Occurrences:   1    Expiration Date:   05/13/2025   Erythropoietin    Standing Status:   Future    Number of Occurrences:   1    Expiration Date:   05/13/2025   Copper , serum    Standing Status:   Future    Number of Occurrences:   1    Expiration Date:   05/13/2025   Lactate dehydrogenase (LDH)    Standing Status:   Future    Number of Occurrences:   1    Expiration Date:   05/13/2025   Retic Panel    Standing Status:   Future    Number of Occurrences:   1    Expiration Date:   05/13/2025    All questions were answered. The patient knows to call the clinic with any problems, questions or concerns.  I have spent a total of 60 minutes minutes of face-to-face and non-face-to-face time, preparing to see the patient, obtaining and/or reviewing separately obtained history, performing a medically appropriate examination, counseling and educating the patient, ordering tests/procedures,  documenting clinical information in the electronic health record, independently interpreting results and communicating results to the patient, and care coordination.   Johnston Police, PA-C Department of Hematology/Oncology [facility] Phone: [phone]  Patient was seen with Dr. Federico  I have read the above note and personally examined the patient. I agree with the assessment and plan as noted above.  Briefly Mr. Rylynn Schoneman is a 58 year old male who presents for evaluation of pancytopenia.  He has a macrocytic anemia as well as a mild thrombocytopenia and low absolute lymphocyte count.  He has not recently had any infections such as runny nose, sore throat, cough, fevers, chills, sweats.  Of note the patient has a history of kidney transplants but remains in ESRD and is currently on dialysis.  He is also on immunosuppressive medications.  At  this time findings do appear most consistent with suppression of his blood counts due to his chronic disease as well as immunosuppressive medications.  We will rule out alternative etiologies with full nutritional evaluation as well as SPEP/serum free light chain evaluation.  He denies any overt signs of bleeding, bruising, or dark stools.  The patient voiced understanding of our findings and recommendations moving forward.   Norleen IVAR Federico, MD Department of Hematology/Oncology [facility] at [facility] Phone: [phone] Pager: [phone] Email: [email or web address]

## 2024-05-13 ENCOUNTER — Telehealth: Payer: Self-pay

## 2024-05-13 ENCOUNTER — Inpatient Hospital Stay: Attending: Physician Assistant | Admitting: Physician Assistant

## 2024-05-13 ENCOUNTER — Telehealth: Payer: Self-pay | Admitting: *Deleted

## 2024-05-13 ENCOUNTER — Inpatient Hospital Stay

## 2024-05-13 ENCOUNTER — Encounter: Payer: Self-pay | Admitting: Physician Assistant

## 2024-05-13 VITALS — BP 126/66 | HR 82 | Temp 98.3°F | Resp 14 | Wt 207.6 lb

## 2024-05-13 DIAGNOSIS — D539 Nutritional anemia, unspecified: Secondary | ICD-10-CM

## 2024-05-13 DIAGNOSIS — D61818 Other pancytopenia: Secondary | ICD-10-CM

## 2024-05-13 LAB — CBC WITH DIFFERENTIAL (CANCER CENTER ONLY)
Abs Immature Granulocytes: 0.04 K/uL (ref 0.00–0.07)
Basophils Absolute: 0 K/uL (ref 0.0–0.1)
Basophils Relative: 0 %
Eosinophils Absolute: 0.2 K/uL (ref 0.0–0.5)
Eosinophils Relative: 3 %
HCT: 35.9 % — ABNORMAL LOW (ref 39.0–52.0)
Hemoglobin: 11.2 g/dL — ABNORMAL LOW (ref 13.0–17.0)
Immature Granulocytes: 1 %
Lymphocytes Relative: 13 %
Lymphs Abs: 0.7 K/uL (ref 0.7–4.0)
MCH: 31.7 pg (ref 26.0–34.0)
MCHC: 31.2 g/dL (ref 30.0–36.0)
MCV: 101.7 fL — ABNORMAL HIGH (ref 80.0–100.0)
Monocytes Absolute: 0.6 K/uL (ref 0.1–1.0)
Monocytes Relative: 10 %
Neutro Abs: 4 K/uL (ref 1.7–7.7)
Neutrophils Relative %: 73 %
Platelet Count: 163 K/uL (ref 150–400)
RBC: 3.53 MIL/uL — ABNORMAL LOW (ref 4.22–5.81)
RDW: 16.9 % — ABNORMAL HIGH (ref 11.5–15.5)
WBC Count: 5.5 K/uL (ref 4.0–10.5)
nRBC: 0 % (ref 0.0–0.2)

## 2024-05-13 LAB — LACTATE DEHYDROGENASE: LDH: 220 U/L (ref 105–235)

## 2024-05-13 LAB — CMP (CANCER CENTER ONLY)
ALT: 14 U/L (ref 0–44)
AST: 25 U/L (ref 15–41)
Albumin: 4.1 g/dL (ref 3.5–5.0)
Alkaline Phosphatase: 129 U/L — ABNORMAL HIGH (ref 38–126)
Anion gap: 14 (ref 5–15)
BUN: 39 mg/dL — ABNORMAL HIGH (ref 6–20)
CO2: 29 mmol/L (ref 22–32)
Calcium: 9 mg/dL (ref 8.9–10.3)
Chloride: 98 mmol/L (ref 98–111)
Creatinine: 8.52 mg/dL (ref 0.61–1.24)
GFR, Estimated: 7 mL/min — ABNORMAL LOW (ref >60)
Glucose, Bld: 71 mg/dL (ref 70–99)
Potassium: 4 mmol/L (ref 3.5–5.1)
Sodium: 141 mmol/L (ref 135–145)
Total Bilirubin: 0.6 mg/dL (ref 0.0–1.2)
Total Protein: 8.8 g/dL — ABNORMAL HIGH (ref 6.5–8.1)

## 2024-05-13 LAB — TECHNOLOGIST SMEAR REVIEW: Plt Morphology: NORMAL

## 2024-05-13 LAB — RETIC PANEL
Immature Retic Fract: 15.8 % (ref 2.3–15.9)
RBC.: 3.52 MIL/uL — ABNORMAL LOW (ref 4.22–5.81)
Retic Count, Absolute: 58.1 K/uL (ref 19.0–186.0)
Retic Ct Pct: 1.7 % (ref 0.4–3.1)
Reticulocyte Hemoglobin: 28 pg (ref >27.9)

## 2024-05-13 NOTE — Telephone Encounter (Signed)
 Contacted by [facility] lab - Lab has enough blood In purple top for CBC, retic, tech review and for copper. They got enough for either MM and Kappa Lambda OR CMP/LDH.  Dr Federico informed - he said they should run the MM and Kappa Lambda. Contacted lab with this information, Luke ORN verbalized understanding.  Johnston Police, PA also informed of above

## 2024-05-13 NOTE — Telephone Encounter (Signed)
 CRITICAL VALUE STICKER  CRITICAL VALUE:Creatinine 8.52  RECEIVER (on-site recipient of call):Jenner Rosier, LPN  DATE & TIME NOTIFIED: 05/13/2024  12:50  MESSENGER (representative from lab):Eleanor  MD NOTIFIED: Johnston Police, PA  TIME OF NOTIFICATION:12:52  RESPONSE:

## 2024-05-15 LAB — KAPPA/LAMBDA LIGHT CHAINS
Kappa free light chain: 434.5 mg/L — ABNORMAL HIGH (ref 3.3–19.4)
Kappa, lambda light chain ratio: 1.53 (ref 0.26–1.65)
Lambda free light chains: 283.2 mg/L — ABNORMAL HIGH (ref 5.7–26.3)

## 2024-05-16 LAB — MULTIPLE MYELOMA PANEL, SERUM
Albumin SerPl Elph-Mcnc: 3.6 g/dL (ref 2.9–4.4)
Albumin/Glob SerPl: 0.7 (ref 0.7–1.7)
Alpha 1: 0.3 g/dL (ref 0.0–0.4)
Alpha2 Glob SerPl Elph-Mcnc: 1 g/dL (ref 0.4–1.0)
B-Globulin SerPl Elph-Mcnc: 1.2 g/dL (ref 0.7–1.3)
Gamma Glob SerPl Elph-Mcnc: 2.9 g/dL — ABNORMAL HIGH (ref 0.4–1.8)
Globulin, Total: 5.4 g/dL — ABNORMAL HIGH (ref 2.2–3.9)
IgA: 519 mg/dL — ABNORMAL HIGH (ref 90–386)
IgG (Immunoglobin G), Serum: 2809 mg/dL — ABNORMAL HIGH (ref 603–1613)
IgM (Immunoglobulin M), Srm: 79 mg/dL (ref 20–172)
Total Protein ELP: 9 g/dL — ABNORMAL HIGH (ref 6.0–8.5)

## 2024-05-16 LAB — COPPER, SERUM: Copper: 126 ug/dL (ref 69–132)

## 2024-06-02 ENCOUNTER — Telehealth: Payer: Self-pay | Admitting: Physician Assistant

## 2024-06-02 NOTE — Telephone Encounter (Signed)
 I called patient to review the lab results from 05/13/2024. Findings show anemia with Hgb 11.2, MCV 101.7. No other cytopenias with normal WBC and Plt count. Myeloma panel did not detect monoclonal protein. There is no evidence of copper  deficiency either.Peripheral smear was unremarkable.   Anemia is secondary to CKD. No indication for Epo injections at this time since Hgb is >10. Recommend to monitor at this time and repeat labs/visit in 6 months.   Mr. Naomie expressed understanding of the plan provided.

## 2024-07-09 ENCOUNTER — Encounter (HOSPITAL_COMMUNITY): Payer: Self-pay

## 2024-07-09 ENCOUNTER — Telehealth: Payer: Self-pay | Admitting: Nephrology

## 2024-07-09 ENCOUNTER — Inpatient Hospital Stay (HOSPITAL_COMMUNITY)
Admission: EM | Admit: 2024-07-09 | Discharge: 2024-07-12 | DRG: 314 | Disposition: A | Attending: Family Medicine | Admitting: Family Medicine

## 2024-07-09 ENCOUNTER — Other Ambulatory Visit: Payer: Self-pay

## 2024-07-09 DIAGNOSIS — Z7901 Long term (current) use of anticoagulants: Secondary | ICD-10-CM

## 2024-07-09 DIAGNOSIS — Z87891 Personal history of nicotine dependence: Secondary | ICD-10-CM

## 2024-07-09 DIAGNOSIS — Z5941 Food insecurity: Secondary | ICD-10-CM

## 2024-07-09 DIAGNOSIS — D696 Thrombocytopenia, unspecified: Secondary | ICD-10-CM | POA: Diagnosis present

## 2024-07-09 DIAGNOSIS — Z635 Disruption of family by separation and divorce: Secondary | ICD-10-CM

## 2024-07-09 DIAGNOSIS — I890 Lymphedema, not elsewhere classified: Secondary | ICD-10-CM | POA: Diagnosis present

## 2024-07-09 DIAGNOSIS — I120 Hypertensive chronic kidney disease with stage 5 chronic kidney disease or end stage renal disease: Secondary | ICD-10-CM | POA: Diagnosis present

## 2024-07-09 DIAGNOSIS — T82848A Pain from vascular prosthetic devices, implants and grafts, initial encounter: Principal | ICD-10-CM | POA: Diagnosis present

## 2024-07-09 DIAGNOSIS — Z992 Dependence on renal dialysis: Secondary | ICD-10-CM

## 2024-07-09 DIAGNOSIS — F32A Depression, unspecified: Secondary | ICD-10-CM | POA: Diagnosis present

## 2024-07-09 DIAGNOSIS — Z7952 Long term (current) use of systemic steroids: Secondary | ICD-10-CM

## 2024-07-09 DIAGNOSIS — E875 Hyperkalemia: Secondary | ICD-10-CM | POA: Diagnosis present

## 2024-07-09 DIAGNOSIS — Z8673 Personal history of transient ischemic attack (TIA), and cerebral infarction without residual deficits: Secondary | ICD-10-CM

## 2024-07-09 DIAGNOSIS — M25522 Pain in left elbow: Secondary | ICD-10-CM | POA: Diagnosis present

## 2024-07-09 DIAGNOSIS — N2581 Secondary hyperparathyroidism of renal origin: Secondary | ICD-10-CM | POA: Diagnosis present

## 2024-07-09 DIAGNOSIS — T8612 Kidney transplant failure: Secondary | ICD-10-CM | POA: Diagnosis present

## 2024-07-09 DIAGNOSIS — N186 End stage renal disease: Secondary | ICD-10-CM | POA: Diagnosis present

## 2024-07-09 DIAGNOSIS — Z888 Allergy status to other drugs, medicaments and biological substances status: Secondary | ICD-10-CM

## 2024-07-09 DIAGNOSIS — M109 Gout, unspecified: Secondary | ICD-10-CM | POA: Diagnosis present

## 2024-07-09 DIAGNOSIS — Y830 Surgical operation with transplant of whole organ as the cause of abnormal reaction of the patient, or of later complication, without mention of misadventure at the time of the procedure: Secondary | ICD-10-CM | POA: Diagnosis present

## 2024-07-09 DIAGNOSIS — Z86718 Personal history of other venous thrombosis and embolism: Secondary | ICD-10-CM

## 2024-07-09 DIAGNOSIS — I721 Aneurysm of artery of upper extremity: Secondary | ICD-10-CM | POA: Diagnosis present

## 2024-07-09 DIAGNOSIS — D631 Anemia in chronic kidney disease: Secondary | ICD-10-CM | POA: Diagnosis present

## 2024-07-09 DIAGNOSIS — Z91041 Radiographic dye allergy status: Secondary | ICD-10-CM

## 2024-07-09 DIAGNOSIS — Z91040 Latex allergy status: Secondary | ICD-10-CM

## 2024-07-09 DIAGNOSIS — Z79899 Other long term (current) drug therapy: Secondary | ICD-10-CM

## 2024-07-09 DIAGNOSIS — T82590A Other mechanical complication of surgically created arteriovenous fistula, initial encounter: Secondary | ICD-10-CM | POA: Diagnosis present

## 2024-07-09 LAB — CBC
HCT: 38.6 % — ABNORMAL LOW (ref 39.0–52.0)
Hemoglobin: 12.2 g/dL — ABNORMAL LOW (ref 13.0–17.0)
MCH: 33.2 pg (ref 26.0–34.0)
MCHC: 31.6 g/dL (ref 30.0–36.0)
MCV: 104.9 fL — ABNORMAL HIGH (ref 80.0–100.0)
Platelets: 87 K/uL — ABNORMAL LOW (ref 150–400)
RBC: 3.68 MIL/uL — ABNORMAL LOW (ref 4.22–5.81)
RDW: 19.9 % — ABNORMAL HIGH (ref 11.5–15.5)
WBC: 3.4 K/uL — ABNORMAL LOW (ref 4.0–10.5)
nRBC: 0 % (ref 0.0–0.2)

## 2024-07-09 LAB — BASIC METABOLIC PANEL WITH GFR
Anion gap: 17 — ABNORMAL HIGH (ref 5–15)
BUN: 45 mg/dL — ABNORMAL HIGH (ref 6–20)
CO2: 26 mmol/L (ref 22–32)
Calcium: 8.7 mg/dL — ABNORMAL LOW (ref 8.9–10.3)
Chloride: 98 mmol/L (ref 98–111)
Creatinine, Ser: 8.19 mg/dL — ABNORMAL HIGH (ref 0.61–1.24)
GFR, Estimated: 7 mL/min — ABNORMAL LOW
Glucose, Bld: 122 mg/dL — ABNORMAL HIGH (ref 70–99)
Potassium: 4.2 mmol/L (ref 3.5–5.1)
Sodium: 141 mmol/L (ref 135–145)

## 2024-07-09 MED ORDER — ONDANSETRON HCL 4 MG/2ML IJ SOLN: 4.0000 mg | Freq: Four times a day (QID) | INTRAMUSCULAR | Status: DC | PRN

## 2024-07-09 MED ORDER — METHYLPREDNISOLONE SODIUM SUCC 40 MG IJ SOLR
40.0000 mg | Freq: Once | INTRAMUSCULAR | Status: AC
  Administered 2024-07-09: 40 mg via INTRAVENOUS
  Filled 2024-07-09: qty 1

## 2024-07-09 MED ORDER — HEPARIN SODIUM (PORCINE) 5000 UNIT/ML IJ SOLN
5000.0000 [IU] | Freq: Three times a day (TID) | INTRAMUSCULAR | Status: DC
  Administered 2024-07-09 – 2024-07-12 (×6): 5000 [IU] via SUBCUTANEOUS
  Filled 2024-07-09 (×7): qty 1

## 2024-07-09 MED ORDER — ACETAMINOPHEN 325 MG PO TABS: 650.0000 mg | ORAL_TABLET | Freq: Four times a day (QID) | ORAL | Status: DC | PRN

## 2024-07-09 MED ORDER — TACROLIMUS ER 4 MG PO TB24
12.0000 mg | ORAL_TABLET | Freq: Every day | ORAL | Status: DC
  Administered 2024-07-10 – 2024-07-11 (×2): 12 mg via ORAL
  Filled 2024-07-09 (×2): qty 3

## 2024-07-09 MED ORDER — OXYCODONE HCL 5 MG PO TABS
10.0000 mg | ORAL_TABLET | ORAL | Status: DC | PRN
  Administered 2024-07-09 – 2024-07-10 (×3): 10 mg via ORAL
  Filled 2024-07-09 (×3): qty 2

## 2024-07-09 MED ORDER — PANTOPRAZOLE SODIUM 40 MG PO TBEC
40.0000 mg | DELAYED_RELEASE_TABLET | Freq: Every day | ORAL | Status: DC
  Administered 2024-07-10 – 2024-07-12 (×3): 40 mg via ORAL
  Filled 2024-07-09 (×3): qty 1

## 2024-07-09 MED ORDER — DIPHENHYDRAMINE HCL 50 MG/ML IJ SOLN
50.0000 mg | Freq: Once | INTRAMUSCULAR | Status: AC
  Administered 2024-07-10: 50 mg via INTRAVENOUS
  Filled 2024-07-09: qty 1

## 2024-07-09 MED ORDER — METHYLPREDNISOLONE SODIUM SUCC 40 MG IJ SOLR
40.0000 mg | Freq: Once | INTRAMUSCULAR | Status: AC
  Administered 2024-07-10: 40 mg via INTRAVENOUS
  Filled 2024-07-09: qty 1

## 2024-07-09 MED ORDER — ACETAMINOPHEN 650 MG RE SUPP: 650.0000 mg | Freq: Four times a day (QID) | RECTAL | Status: DC | PRN

## 2024-07-09 MED ORDER — DIPHENHYDRAMINE HCL 25 MG PO CAPS: 50.0000 mg | ORAL_CAPSULE | Freq: Once | ORAL | Status: AC

## 2024-07-09 MED ORDER — ONDANSETRON HCL 4 MG PO TABS: 4.0000 mg | ORAL_TABLET | Freq: Four times a day (QID) | ORAL | Status: DC | PRN

## 2024-07-09 MED ORDER — HYDROMORPHONE HCL 1 MG/ML IJ SOLN
1.0000 mg | Freq: Once | INTRAMUSCULAR | Status: AC
  Administered 2024-07-09: 1 mg via INTRAVENOUS
  Filled 2024-07-09: qty 1

## 2024-07-09 MED ORDER — PREDNISONE 5 MG PO TABS
5.0000 mg | ORAL_TABLET | Freq: Every day | ORAL | Status: DC
  Administered 2024-07-10 – 2024-07-12 (×2): 5 mg via ORAL
  Filled 2024-07-09 (×3): qty 1

## 2024-07-09 MED ORDER — MYCOPHENOLATE SODIUM 180 MG PO TBEC
360.0000 mg | DELAYED_RELEASE_TABLET | Freq: Two times a day (BID) | ORAL | Status: DC
  Filled 2024-07-09: qty 2

## 2024-07-09 NOTE — Telephone Encounter (Signed)
 ADDING NOTE FOR CONTINUITY OF CARE -> Seen while on HD today 1/21 at 11:15am. ADVISED TO COME TO ED AFTER DIALYSIS TODAY FOR VASCULAR EVALUATION.  HX ESRD - BACK ON HD S/P FAILED TRANSPLANT. MULTIPLE PRIOR BUE ACCESS. HAS NOTICED NEW ENLARGING PULSATILE MASS IN LEFT ANTECUBITAL AREA. STARTED AFTER ROLLING OVER IN BED? HE IS NOT SURE, VERY PAINFUL.   DEFINITELY CONCERNING FOR ANEURYSM V. PSEUDOANEURYSM?  REC ED EVALUATION WITH VASCULAR ULTRASOUND AND/OR VASCULAR CONSULT.  PLS CALL WITH ANY QUESTIONS.  [name], PA-C [employer] Pager [phone]

## 2024-07-09 NOTE — ED Notes (Signed)
 Awaiting patient from lobby.

## 2024-07-09 NOTE — H&P (Signed)
 " History and Physical    [name] FMW:[id number] DOB: Feb 18, 1966 DOA: 07/09/2024  I have briefly reviewed the patient's prior medical records in [facility]  PCP: Catalina Bare, MD  Patient coming from: home  Chief Complaint: left arm pain  HPI: Nicholas Green is a 59 y.o. male with medical history significant of ESRD on HD MWF, prior failed kidney transplant, prior CVA who comes into the hospital with complaints of left arm pain in the [facility] area.  He has an old dialysis access there, not clear what, and over the last couple days has noticed increased pain and appearance of a pulsatile mass in the area.  His current dialysis is through a tunneled HD cath placed last year.  Most recent dialysis was done earlier today and was uneventful.  He denies any recent fever or chills, denies any chest pain, denies any shortness of breath.  He has no abdominal pain, no nausea or vomiting.  He reports chronic swelling of the right arm and has a history of lymphedema.  Vascular surgery was consulted in the ER for left arm pulsatile mass and we are asked to admit.  Of note, apparently has had multiple access surgeries on this left side at [facility] and [facility].  ED Course: In the emergency room he is afebrile, normotensive, satting well on room air.  Blood work shows Cr 8.29, platelets 87.  Vascular surgery consulted and recommending obtaining a CT angiogram, however patient needs to be premedicated since he has contrast allergy.  He may need to have surgical intervention once CT angio was done.  Review of Systems: All systems reviewed, and apart from HPI, all negative  Past Medical History:  Diagnosis Date   Chronic renal failure    S/P renal transplant   Condyloma    meatal condyloma    Dialysis patient    reports he gets dialysis every evening ; has graft in left upper extremity, shunt in right forearm and also has peritoneal access   Gout    Hypertension    Reflux    S/P kidney transplant     Stroke [facility])    SVT (supraventricular tachycardia)    SEE ED VISIT EPIC CARE EVERYWHERE 03-06-18 ; PATIENT HAD LOW PAOTASSIUM LEVELS, CARDIO CONSULT DONE AND UNDERWENT STRESS TEST AND ECHO . DENIES RECURENCE OF SX AT TODAY PRE-OP 04-29-18     Past Surgical History:  Procedure Laterality Date   AV FISTULA PLACEMENT     left arm   CYSTOSCOPY WITH BIOPSY N/A 05/07/2018   Procedure: CYSTOSCOPY WITH EXCISIONAL BIOPSY OF PENILE CONDYLOMA;  Surgeon: Watt Rush, MD;  Location: [facility];  Service: Urology;  Laterality: N/A;   HERNIA REPAIR     IR US  GUIDE VASC ACCESS RIGHT  07/20/2021   IR US  GUIDE VASC ACCESS RIGHT  07/20/2021   IR US  GUIDE VASC ACCESS RIGHT  07/20/2021   IR VENOCAVAGRAM SVC  07/20/2021   KIDNEY TRANSPLANT Right 2008   NASAL FRACTURE SURGERY       reports that he has quit smoking. His smoking use included cigarettes. He has never used smokeless tobacco. He reports that he does not drink alcohol and does not use drugs.  Allergies[1]  History reviewed. No pertinent family history.  Prior to Admission medications  Medication Sig Start Date End Date Taking? Authorizing Provider  mycophenolate  (MYFORTIC ) 360 MG TBEC EC tablet Take 360 mg by mouth 2 (two) times daily.    [provider]  omeprazole (  PRILOSEC) 40 MG capsule Take 40 mg by mouth daily.    [provider]  oxycodone  (ROXICODONE ) 30 MG immediate release tablet Take 10 mg by mouth every 4 (four) hours as needed for pain.    [provider]  predniSONE  (DELTASONE ) 5 MG tablet Take 5 mg by mouth daily with breakfast.    [provider]  tacrolimus  ER (ENVARSUS  XR) 4 MG TB24 Take 12 mg by mouth daily. 3 tablets daily = 12 mg 05/04/20   [provider]  Vitamin D, Ergocalciferol, (DRISDOL) 1.25 MG (50000 UNIT) CAPS capsule Take 50,000 Units by mouth every 30 (thirty) days.    [provider]    Physical Exam: Vitals:   07/09/24 1534 07/09/24 1619  BP: (!) 137/96   Pulse:  81   Resp: 19   Temp: (!) 97.5 F (36.4 C)   SpO2: 100%   Weight:  97 kg  Height:  6' 1 (1.854 m)   Constitutional: NAD, calm, comfortable Eyes: PERRL, lids and conjunctivae normal ENMT: Mucous membranes are moist.   Neck: normal, supple Respiratory: clear to auscultation bilaterally, no wheezing, no crackles.  Cardiovascular: Regular rate and rhythm, no murmurs / rubs / gallops. No extremity edema.  Abdomen: no tenderness, no masses palpated. Bowel sounds positive.  Musculoskeletal: no clubbing / cyanosis. Normal muscle tone.  Skin: no rashes, lesions, ulcers. No induration.  Left arm has a pulsatile mass on the medial aspect of the AC. Neurologic: Nonfocal  Labs on Admission: I have personally reviewed following labs and imaging studies  CBC: No results for input(s): WBC, NEUTROABS, HGB, HCT, MCV, PLT in the last 168 hours. Basic Metabolic Panel: No results for input(s): NA, K, CL, CO2, GLUCOSE, BUN, CREATININE, CALCIUM , MG, PHOS in the last 168 hours. Liver Function Tests: No results for input(s): AST, ALT, ALKPHOS, BILITOT, PROT, ALBUMIN in the last 168 hours. Coagulation Profile: No results for input(s): INR, PROTIME in the last 168 hours. BNP (last 3 results) No results for input(s): PROBNP in the last 8760 hours. CBG: No results for input(s): GLUCAP in the last 168 hours. Thyroid Function Tests: No results for input(s): TSH, T4TOTAL, FREET4, T3FREE, THYROIDAB in the last 72 hours. Urine analysis:    Component Value Date/Time   COLORURINE YELLOW 06/08/2020 0006   APPEARANCEUR CLEAR 06/08/2020 0006   LABSPEC 1.015 06/08/2020 0006   PHURINE 7.0 06/08/2020 0006   GLUCOSEU NEGATIVE 06/08/2020 0006   HGBUR NEGATIVE 06/08/2020 0006   BILIRUBINUR NEGATIVE 06/08/2020 0006   KETONESUR NEGATIVE 06/08/2020 0006   PROTEINUR NEGATIVE 06/08/2020 0006   UROBILINOGEN 0.2 06/01/2010 1050   NITRITE NEGATIVE 06/08/2020  0006   LEUKOCYTESUR NEGATIVE 06/08/2020 0006     Radiological Exams on Admission: No results found.  EKG: Independently reviewed.  Sinus rhythm on the monitor  Assessment/Plan Principal problem Left arm/AC pulsatile mass -history of prior dialysis access in that area, multiple surgeries [facility].  Vascular surgery consulted, evaluated patient and recommending admission, CT angiogram with premedication given allergies - Will allow diet tonight, n.p.o. past midnight in case he needs surgical intervention tomorrow  Active problems ESRD, history of renal transplant -continue home medications with tacrolimus , prednisone , mycophenolate .  He is on MWF dialysis patient, just got dialyzed earlier this morning.  Will need nephrology consultation in the morning if patient is here on Friday for routine dialysis  Thrombocytopenia-acute on chronic, has had low platelets in the past but now lower than his baseline.  Continue to monitor.  Right arm lymphedema-noted  DVT prophylaxis: Heparin  subcu Code Status: Full code Family Communication: No family at bedside Bed Type: Medical telemetry Consults called: Vascular surgery Obs/Inp: Observation   Nilda Fendt, MD, PhD [facility]  Contact via [email or web address]  07/09/2024, 7:33 PM         [1]  Allergies Allergen Reactions   Ivp Dye [Iodinated Contrast Media] Swelling   Metrizamide Swelling   "

## 2024-07-09 NOTE — ED Triage Notes (Signed)
 Pt arrives via POV. PT reports he was sent over to the ED from dialysis center. Pt has an old fistula in his left arm, provider is concerned for possible aneurysm from prior AVF due to the old fistula now being pulsatile. Pt reports he made it through a full session of dialysis. He has swelling to right arm as well but reports that is his baseline.

## 2024-07-09 NOTE — ED Provider Triage Note (Signed)
 Emergency Medicine Provider Triage Evaluation Note  Nicholas Green , a 59 y.o. male  was evaluated in triage.  Pt complains of left arm swelling and pain. 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 Patient no longer receives HD on arm as he has HD catheter on left chest. Pt states symptoms started two days ago with pain and tenderness to area. Symptoms have remained with little change. Consulting with vascular team regarding orders  Pt denies Chest pain, SOB, paraesthesias, decreased sensation.  Review of Systems  Positive: Arm pain Negative: Chest pain  Physical Exam  BP (!) 137/96 (BP Location: Left Wrist)   Pulse 81   Temp (!) 97.5 F (36.4 C)   Resp 19   Ht 6' 1 (1.854 m)   Wt 97 kg   SpO2 100%   BMI 28.21 kg/m  Gen:   Awake, no distress   Resp:  Normal effort  MSK:   Moves extremities without difficulty  Other:    Medical Decision Making  Medically screening exam initiated at 4:55 PM.  Appropriate orders placed.  [name] was informed that the remainder of the evaluation will be completed by another provider, this initial triage assessment does not replace that evaluation, and the importance of remaining in the ED until their evaluation is complete.     Harold Tillman DASEN, PA-C 07/09/24 1731

## 2024-07-09 NOTE — Consult Note (Signed)
 " Hospital Consult    Reason for Consult:  Left arm pulsatile mass  MRN #:  [id number]  History of Present Illness: This is a 59 y.o. male with ESRD on HD.  He presents to the emergency department today after dialysis.  He is noted a swelling in his left elbow region over the last few days and has gotten painful.  He explains that he has had multiple access surgeries in his left arm and has also had a hero graft.  These of all failed and he now gets dialysis through tunneled dialysis catheter.  His right arm is chronically swollen from lymphedema.  He denies any injury to the left elbow or recent attempts at access in this area.  He does not have any ulceration.  Past Medical History:  Diagnosis Date   Chronic renal failure    S/P renal transplant   Condyloma    meatal condyloma    Dialysis patient    reports he gets dialysis every evening ; has graft in left upper extremity, shunt in right forearm and also has peritoneal access   Gout    Hypertension    Reflux    S/P kidney transplant    Stroke [facility])    SVT (supraventricular tachycardia)    SEE ED VISIT EPIC CARE EVERYWHERE 03-06-18 ; PATIENT HAD LOW PAOTASSIUM LEVELS, CARDIO CONSULT DONE AND UNDERWENT STRESS TEST AND ECHO . DENIES RECURENCE OF SX AT TODAY PRE-OP 04-29-18     Past Surgical History:  Procedure Laterality Date   AV FISTULA PLACEMENT     left arm   CYSTOSCOPY WITH BIOPSY N/A 05/07/2018   Procedure: CYSTOSCOPY WITH EXCISIONAL BIOPSY OF PENILE CONDYLOMA;  Surgeon: Watt Rush, MD;  Location: [facility];  Service: Urology;  Laterality: N/A;   HERNIA REPAIR     IR US  GUIDE VASC ACCESS RIGHT  07/20/2021   IR US  GUIDE VASC ACCESS RIGHT  07/20/2021   IR US  GUIDE VASC ACCESS RIGHT  07/20/2021   IR VENOCAVAGRAM SVC  07/20/2021   KIDNEY TRANSPLANT Right 2008   NASAL FRACTURE SURGERY      Allergies[1]  Prior to Admission medications  Medication Sig Start Date End Date Taking? Authorizing Provider  mycophenolate  (MYFORTIC ) 360 MG  TBEC EC tablet Take 360 mg by mouth 2 (two) times daily.    [provider]  omeprazole (PRILOSEC) 40 MG capsule Take 40 mg by mouth daily.    [provider]  oxycodone  (ROXICODONE ) 30 MG immediate release tablet Take 10 mg by mouth every 4 (four) hours as needed for pain.    [provider]  predniSONE  (DELTASONE ) 5 MG tablet Take 5 mg by mouth daily with breakfast.    [provider]  tacrolimus  ER (ENVARSUS  XR) 4 MG TB24 Take 12 mg by mouth daily. 3 tablets daily = 12 mg 05/04/20   [provider]  Vitamin D, Ergocalciferol, (DRISDOL) 1.25 MG (50000 UNIT) CAPS capsule Take 50,000 Units by mouth every 30 (thirty) days.    [provider]    Social History   Socioeconomic History   Marital status: Legally Separated    Spouse name: Not on file   Number of children: Not on file   Years of education: Not on file   Highest education level: Not on file  Occupational History   Not on file  Tobacco Use   Smoking status: Former    Types: Cigarettes   Smokeless tobacco: Never  Vaping Use   Vaping status:  Never Used  Substance and Sexual Activity   Alcohol use: No   Drug use: No   Sexual activity: Not on file  Other Topics Concern   Not on file  Social History Narrative   Not on file   Social Drivers of Health   Tobacco Use: Medium Risk (07/09/2024)   Patient History    Smoking Tobacco Use: Former    Smokeless Tobacco Use: Never    Passive Exposure: Not on file  Financial Resource Strain: Not on File (09/07/2021)   Received from [employer]    Financial Resource Strain: 0  Food Insecurity: Food Insecurity Present (05/13/2024)   Epic    Worried About Programme Researcher, Broadcasting/film/video in the Last Year: Never true    Ran Out of Food in the Last Year: Sometimes true  Transportation Needs: No Transportation Needs (05/13/2024)   Epic    Lack of Transportation (Medical): No    Lack of Transportation (Non-Medical): No   Physical Activity: Not on File (09/07/2021)   Received from [facility]   Physical Activity    Physical Activity: 0  Stress: Not on File (09/07/2021)   Received from [facility]   Stress    Stress: 0  Social Connections: Not on File (03/03/2023)   Received from [facility]   Social Connections    Connectedness: 0  Intimate Partner Violence: Not At Risk (05/13/2024)   Epic    Fear of Current or Ex-Partner: No    Emotionally Abused: No    Physically Abused: No    Sexually Abused: No  Depression (PHQ2-9): Low Risk (05/13/2024)   Depression (PHQ2-9)    PHQ-2 Score: 1  Alcohol Screen: Not on file  Housing: Unknown (05/13/2024)   Epic    Unable to Pay for Housing in the Last Year: No    Number of Times Moved in the Last Year: Not on file    Homeless in the Last Year: No  Utilities: Not At Risk (05/13/2024)   Epic    Threatened with loss of utilities: No  Health Literacy: Not on file    History reviewed. No pertinent family history.  ROS: Otherwise negative unless mentioned in HPI  Physical Examination  Vitals:   07/09/24 1534  BP: (!) 137/96  Pulse: 81  Resp: 19  Temp: (!) 97.5 F (36.4 C)  SpO2: 100%   Body mass index is 28.21 kg/m.  General: no acute distress Cardiac: hemodynamically stable Extremities: Pulsatile mass on medial aspect of left elbow.  Skin intact.  No signs of ulcer or previous wound.   Data:   Awaiting CTA, contrast allergy receiving premedication    ASSESSMENT/PLAN: This is a 59 y.o. male presenting for a pulsatile mass in the left [facility].  He is at multiple access surgeries on this left side and this occurred spontaneously over the last few days and is also painful and tender to palpation.  His left arm has not been accessed in a while as he receives dialysis through tunneled dialysis catheter.  It is unclear whether this is a longstanding graft pseudoaneurysm that has slowly grown and just noticed over the last few days versus an anastomotic disruption.  His  skin is intact and he has no signs of ulceration or skin thinning over top of the area.  He has had most of his work done at [facility] and [facility] therefore prior to planning any intervention and like to obtain a CTA for further evaluation.  He has a contrast  allergy and will be admitted for premedication.   Norman GORMAN Serve MD [facility] [phone] 07/09/2024  6:52 PM     [1]  Allergies Allergen Reactions   Ivp Dye [Iodinated Contrast Media] Swelling   Metrizamide Swelling   "

## 2024-07-09 NOTE — ED Provider Notes (Signed)
 " Nicholas Green   CSN: [id number] Arrival date & time: 07/09/24  1525     Patient presents with: Arm Pain and Arm Swelling   Nicholas Green is a 59 y.o. male who presents emergency department for evaluation of pulsatile painful region of his left arm he has a history of end-stage renal disease on dialysis.  Patient 2 days ago noticed swelling and pain in his old fistula site.  He is not actively using it and uses dialysis catheter in his left subclavian region. Patient had full dialysis today but was sent in for further evaluation of his fistula for concern of potential development of an aneurysm.    Arm Pain       Prior to Admission medications  Medication Sig Start Date End Date Taking? Authorizing Provider  mycophenolate  (MYFORTIC ) 360 MG TBEC EC tablet Take 360 mg by mouth 2 (two) times daily.    Provider, Historical, Nicholas Green  omeprazole (PRILOSEC) 40 MG capsule Take 40 mg by mouth daily.    Provider, Historical, Nicholas Green  oxycodone  (ROXICODONE ) 30 MG immediate release tablet Take 10 mg by mouth every 4 (four) hours as needed for pain.    Provider, Historical, Nicholas Green  predniSONE  (DELTASONE ) 5 MG tablet Take 5 mg by mouth daily with breakfast.    Provider, Historical, Nicholas Green  tacrolimus  ER (ENVARSUS  XR) 4 MG TB24 Take 12 mg by mouth daily. 3 tablets daily = 12 mg 05/04/20   Provider, Historical, Nicholas Green  Vitamin D, Ergocalciferol, (DRISDOL) 1.25 MG (50000 UNIT) CAPS capsule Take 50,000 Units by mouth every 30 (thirty) days.    Provider, Historical, Nicholas Green    Allergies: Ivp dye [iodinated contrast media] and Metrizamide    Review of Systems  Updated Vital Signs BP (!) 137/96 (BP Location: Left Wrist)   Pulse 81   Temp (!) 97.5 F (36.4 C)   Resp 19   Ht 6' 1 (1.854 m)   Wt 97 kg   SpO2 100%   BMI 28.21 kg/m   Physical Exam Vitals and nursing Green reviewed.  Constitutional:      General: He is not in acute distress.    Appearance: He  is well-developed. He is not diaphoretic.  HENT:     Head: Normocephalic and atraumatic.  Eyes:     General: No scleral icterus.    Conjunctiva/sclera: Conjunctivae normal.  Cardiovascular:     Rate and Rhythm: Normal rate and regular rhythm.     Heart sounds: Normal heart sounds.  Pulmonary:     Effort: Pulmonary effort is normal. No respiratory distress.     Breath sounds: Normal breath sounds.  Abdominal:     Palpations: Abdomen is soft.     Tenderness: There is no abdominal tenderness.  Musculoskeletal:     Cervical back: Normal range of motion and neck supple.     Comments: Severe lymphedema of the right upper extremity, dialysis catheter in the left chest wall. Left arm with  Skin:    General: Skin is warm and dry.  Neurological:     Mental Status: He is alert.  Psychiatric:        Behavior: Behavior normal.    (all labs ordered are listed, but only abnormal results are displayed) Labs Reviewed - No data to display  EKG: None  Radiology: No results found.   Procedures   Medications Ordered in the ED - No data to display  Clinical Course as of 07/09/24 2142  Wed Jul 09, 2024  1855 Spoke with Nicholas Green - admit- will see am [AH]    Clinical Course User Index [AH] Nicholas Chroman, Nicholas Green                                 Medical Decision Making Amount and/or Complexity of Data Reviewed Labs: ordered. Radiology: ordered.  Risk Prescription drug management. Decision regarding hospitalization.   Nicholas Green is a 59 year old male with a history of end-stage renal disease sent in for evaluation of progressively worsening pulsatile mass in his arm at the site of a previously used AV fistula.  Patient seen in the emergency department by myself assessed at bedside by Nicholas Green with vascular surgery.  Patient will need a CT angiogram of the arm.  Unfortunately has a history of swelling and allergy to contrast and will require pretreatment.  Nicholas Green would like  him admitted to the hospitalist service.  Case discussed with Nicholas Green who will admit the patient with plan for potential revision tomorrow.  He does not have any ulcerations making him high risk for spontaneous rupture. Labs are reassuring he is stable for admission    Final diagnoses:  None    ED Discharge Orders     None          Nicholas Chroman, Nicholas Green 07/09/24 2239    Nicholas Alm Macho, Nicholas Green 07/09/24 [phone]  "

## 2024-07-10 ENCOUNTER — Observation Stay (HOSPITAL_COMMUNITY)

## 2024-07-10 DIAGNOSIS — F32A Depression, unspecified: Secondary | ICD-10-CM | POA: Diagnosis present

## 2024-07-10 DIAGNOSIS — Z888 Allergy status to other drugs, medicaments and biological substances status: Secondary | ICD-10-CM | POA: Diagnosis not present

## 2024-07-10 DIAGNOSIS — Z91041 Radiographic dye allergy status: Secondary | ICD-10-CM | POA: Diagnosis not present

## 2024-07-10 DIAGNOSIS — E875 Hyperkalemia: Secondary | ICD-10-CM | POA: Diagnosis present

## 2024-07-10 DIAGNOSIS — D631 Anemia in chronic kidney disease: Secondary | ICD-10-CM | POA: Diagnosis present

## 2024-07-10 DIAGNOSIS — Z86718 Personal history of other venous thrombosis and embolism: Secondary | ICD-10-CM | POA: Diagnosis not present

## 2024-07-10 DIAGNOSIS — I7789 Other specified disorders of arteries and arterioles: Secondary | ICD-10-CM | POA: Diagnosis not present

## 2024-07-10 DIAGNOSIS — N2581 Secondary hyperparathyroidism of renal origin: Secondary | ICD-10-CM | POA: Diagnosis present

## 2024-07-10 DIAGNOSIS — Z9889 Other specified postprocedural states: Secondary | ICD-10-CM | POA: Diagnosis not present

## 2024-07-10 DIAGNOSIS — Z7901 Long term (current) use of anticoagulants: Secondary | ICD-10-CM | POA: Diagnosis not present

## 2024-07-10 DIAGNOSIS — I890 Lymphedema, not elsewhere classified: Secondary | ICD-10-CM | POA: Diagnosis present

## 2024-07-10 DIAGNOSIS — Z79899 Other long term (current) drug therapy: Secondary | ICD-10-CM | POA: Diagnosis not present

## 2024-07-10 DIAGNOSIS — T82848A Pain from vascular prosthetic devices, implants and grafts, initial encounter: Secondary | ICD-10-CM | POA: Diagnosis present

## 2024-07-10 DIAGNOSIS — Z8673 Personal history of transient ischemic attack (TIA), and cerebral infarction without residual deficits: Secondary | ICD-10-CM | POA: Diagnosis not present

## 2024-07-10 DIAGNOSIS — T8612 Kidney transplant failure: Secondary | ICD-10-CM | POA: Diagnosis present

## 2024-07-10 DIAGNOSIS — Z992 Dependence on renal dialysis: Secondary | ICD-10-CM | POA: Diagnosis not present

## 2024-07-10 DIAGNOSIS — Z635 Disruption of family by separation and divorce: Secondary | ICD-10-CM | POA: Diagnosis not present

## 2024-07-10 DIAGNOSIS — Y830 Surgical operation with transplant of whole organ as the cause of abnormal reaction of the patient, or of later complication, without mention of misadventure at the time of the procedure: Secondary | ICD-10-CM | POA: Diagnosis present

## 2024-07-10 DIAGNOSIS — N186 End stage renal disease: Secondary | ICD-10-CM | POA: Diagnosis present

## 2024-07-10 DIAGNOSIS — Z91040 Latex allergy status: Secondary | ICD-10-CM | POA: Diagnosis not present

## 2024-07-10 DIAGNOSIS — Z7952 Long term (current) use of systemic steroids: Secondary | ICD-10-CM | POA: Diagnosis not present

## 2024-07-10 DIAGNOSIS — T82590A Other mechanical complication of surgically created arteriovenous fistula, initial encounter: Secondary | ICD-10-CM | POA: Diagnosis present

## 2024-07-10 DIAGNOSIS — M109 Gout, unspecified: Secondary | ICD-10-CM | POA: Diagnosis present

## 2024-07-10 DIAGNOSIS — Z87891 Personal history of nicotine dependence: Secondary | ICD-10-CM | POA: Diagnosis not present

## 2024-07-10 DIAGNOSIS — I721 Aneurysm of artery of upper extremity: Secondary | ICD-10-CM | POA: Diagnosis present

## 2024-07-10 DIAGNOSIS — M25522 Pain in left elbow: Secondary | ICD-10-CM | POA: Diagnosis present

## 2024-07-10 DIAGNOSIS — D696 Thrombocytopenia, unspecified: Secondary | ICD-10-CM | POA: Diagnosis present

## 2024-07-10 DIAGNOSIS — I120 Hypertensive chronic kidney disease with stage 5 chronic kidney disease or end stage renal disease: Secondary | ICD-10-CM | POA: Diagnosis present

## 2024-07-10 LAB — COMPREHENSIVE METABOLIC PANEL WITH GFR
ALT: 34 U/L (ref 0–44)
AST: 28 U/L (ref 15–41)
Albumin: 4.1 g/dL (ref 3.5–5.0)
Alkaline Phosphatase: 107 U/L (ref 38–126)
Anion gap: 16 — ABNORMAL HIGH (ref 5–15)
BUN: 54 mg/dL — ABNORMAL HIGH (ref 6–20)
CO2: 21 mmol/L — ABNORMAL LOW (ref 22–32)
Calcium: 9.1 mg/dL (ref 8.9–10.3)
Chloride: 99 mmol/L (ref 98–111)
Creatinine, Ser: 9.64 mg/dL — ABNORMAL HIGH (ref 0.61–1.24)
GFR, Estimated: 6 mL/min — ABNORMAL LOW
Glucose, Bld: 125 mg/dL — ABNORMAL HIGH (ref 70–99)
Potassium: 5.6 mmol/L — ABNORMAL HIGH (ref 3.5–5.1)
Sodium: 137 mmol/L (ref 135–145)
Total Bilirubin: 0.5 mg/dL (ref 0.0–1.2)
Total Protein: 8.5 g/dL — ABNORMAL HIGH (ref 6.5–8.1)

## 2024-07-10 LAB — CBC
HCT: 39.7 % (ref 39.0–52.0)
Hemoglobin: 12.8 g/dL — ABNORMAL LOW (ref 13.0–17.0)
MCH: 33.4 pg (ref 26.0–34.0)
MCHC: 32.2 g/dL (ref 30.0–36.0)
MCV: 103.7 fL — ABNORMAL HIGH (ref 80.0–100.0)
Platelets: 90 K/uL — ABNORMAL LOW (ref 150–400)
RBC: 3.83 MIL/uL — ABNORMAL LOW (ref 4.22–5.81)
RDW: 19.3 % — ABNORMAL HIGH (ref 11.5–15.5)
WBC: 3.6 K/uL — ABNORMAL LOW (ref 4.0–10.5)
nRBC: 0 % (ref 0.0–0.2)

## 2024-07-10 LAB — GLUCOSE, CAPILLARY
Glucose-Capillary: 79 mg/dL (ref 70–99)
Glucose-Capillary: 117 mg/dL — ABNORMAL HIGH (ref 70–99)

## 2024-07-10 LAB — HIV ANTIBODY (ROUTINE TESTING W REFLEX): HIV Screen 4th Generation wRfx: NONREACTIVE

## 2024-07-10 MED ORDER — OXYCODONE HCL 5 MG PO TABS
10.0000 mg | ORAL_TABLET | ORAL | Status: DC | PRN
  Administered 2024-07-10 – 2024-07-12 (×6): 10 mg via ORAL
  Filled 2024-07-10 (×7): qty 2

## 2024-07-10 MED ORDER — PREDNISONE 20 MG PO TABS
50.0000 mg | ORAL_TABLET | Freq: Four times a day (QID) | ORAL | Status: AC
  Administered 2024-07-10 – 2024-07-11 (×3): 50 mg via ORAL
  Filled 2024-07-10 (×3): qty 1

## 2024-07-10 MED ORDER — HYDRALAZINE HCL 20 MG/ML IJ SOLN
10.0000 mg | INTRAMUSCULAR | Status: DC | PRN
  Administered 2024-07-10: 10 mg via INTRAVENOUS
  Filled 2024-07-10: qty 1

## 2024-07-10 MED ORDER — SENNOSIDES-DOCUSATE SODIUM 8.6-50 MG PO TABS: 1.0000 | ORAL_TABLET | Freq: Every evening | ORAL | Status: DC | PRN

## 2024-07-10 MED ORDER — GUAIFENESIN 100 MG/5ML PO LIQD: 5.0000 mL | ORAL | Status: DC | PRN

## 2024-07-10 MED ORDER — DIPHENHYDRAMINE HCL 25 MG PO CAPS
50.0000 mg | ORAL_CAPSULE | Freq: Once | ORAL | Status: AC
  Administered 2024-07-11: 50 mg via ORAL
  Filled 2024-07-10: qty 2

## 2024-07-10 MED ORDER — CHLORHEXIDINE GLUCONATE CLOTH 2 % EX PADS
6.0000 | MEDICATED_PAD | Freq: Every day | CUTANEOUS | Status: DC
  Administered 2024-07-11 – 2024-07-12 (×3): 6 via TOPICAL

## 2024-07-10 MED ORDER — METOPROLOL TARTRATE 5 MG/5ML IV SOLN: 5.0000 mg | INTRAVENOUS | Status: DC | PRN

## 2024-07-10 MED ORDER — IPRATROPIUM-ALBUTEROL 0.5-2.5 (3) MG/3ML IN SOLN: 3.0000 mL | RESPIRATORY_TRACT | Status: DC | PRN

## 2024-07-10 NOTE — Hospital Course (Addendum)
 Brief Narrative:   59 year old with history of ESRD on HD MWF, prior failed kidney transplant, CVA comes to the hospital with left arm pain where his old dialysis access was.  Vascular team was consulted for pulsatile mass in the left arm and was asked to admit.  Will need to obtain CTA but due to contrast allergy was admitted for.  Medication.  Overnight had difficult IV access issue therefore arterial Doppler of the left upper extremity was ordered.  Assessment & Plan:  Left arm/AC pulsatile mass -Previous multiple surgeries at [facility] and [facility] in that area.  Vascular concerned about growing aneurysm.  Initially admitted for CT angiogram with premedication due to contrast allergy but having difficult IV access issue therefore getting ultrasound arterial with vascular recommendations.  ESRD, history of renal transplant - -On tacrolimus , steroids and mycophenolate  - HD MWF. Will consult Renal.    Thrombocytopenia -acute on chronic, has had low platelets in the past but now lower than his baseline.  Continue to monitor.   Right arm lymphedema -noted  DVT prophylaxis: heparin  injection 5,000 Units Start: 07/09/24 2330      Code Status: Full Code Family Communication:   Ongoing management for left axillary mass   PT Follow up Recs:   Subjective: No complaints besides really wanting to eat.  I explained to him we are still pending test   Examination:  General exam: Appears calm and comfortable  Respiratory system: Clear to auscultation. Respiratory effort normal. Cardiovascular system: S1 & S2 heard, RRR. No JVD, murmurs, rubs, gallops or clicks. No pedal edema. Gastrointestinal system: Abdomen is nondistended, soft and nontender. No organomegaly or masses felt. Normal bowel sounds heard. Central nervous system: Alert and oriented. No focal neurological deficits. Extremities: Symmetric 5 x 5 power. Skin: No rashes, lesions or ulcers Psychiatry: Judgement and insight appear normal.  Mood & affect appropriate.

## 2024-07-10 NOTE — Progress Notes (Signed)
 " PROGRESS NOTE    Nicholas Green  FMW:[id number] DOB: [birth year] DOA: 07/09/2024 PCP: Catalina Bare, MD    Brief Narrative:   59 year old with history of ESRD on HD MWF, prior failed kidney transplant, CVA comes to the hospital with left arm pain where his old dialysis access was.  Vascular team was consulted for pulsatile mass in the left arm and was asked to admit.  Will need to obtain CTA but due to contrast allergy was admitted for.  Medication.  Overnight had difficult IV access issue therefore arterial Doppler of the left upper extremity was ordered.  Assessment & Plan:  Left arm/AC pulsatile mass -Previous multiple surgeries at [facility] and [facility] in that area.  Vascular concerned about growing aneurysm.  Initially admitted for CT angiogram with premedication due to contrast allergy but having difficult IV access issue therefore getting ultrasound arterial with vascular recommendations.  ESRD, history of renal transplant - -On tacrolimus , steroids and mycophenolate  - HD MWF. Will consult Renal.    Thrombocytopenia -acute on chronic, has had low platelets in the past but now lower than his baseline.  Continue to monitor.   Right arm lymphedema -noted  DVT prophylaxis: heparin  injection 5,000 Units Start: 07/09/24 2330      Code Status: Full Code Family Communication:   Ongoing management for left axillary mass   PT Follow up Recs:   Subjective: No complaints besides really wanting to eat.  I explained to him we are still pending test   Examination:  General exam: Appears calm and comfortable  Respiratory system: Clear to auscultation. Respiratory effort normal. Cardiovascular system: S1 & S2 heard, RRR. No JVD, murmurs, rubs, gallops or clicks. No pedal edema. Gastrointestinal system: Abdomen is nondistended, soft and nontender. No organomegaly or masses felt. Normal bowel sounds heard. Central nervous system: Alert and oriented. No focal neurological  deficits. Extremities: Symmetric 5 x 5 power. Skin: No rashes, lesions or ulcers Psychiatry: Judgement and insight appear normal. Mood & affect appropriate.                Diet Orders (From admission, onward)     Start     Ordered   07/10/24 0001  Diet NPO time specified  Diet effective midnight        07/09/24 2322            Objective: Vitals:   07/10/24 0014 07/10/24 0413 07/10/24 0551 07/10/24 0935  BP:  (!) 157/82 (!) 159/81   Pulse:  62 67   Resp:  12 17   Temp: 97.6 F (36.4 C)   97.7 F (36.5 C)  TempSrc: Oral     SpO2:  100% 100%   Weight:      Height:       No intake or output data in the 24 hours ending 07/10/24 1015 Filed Weights   07/09/24 1619  Weight: 97 kg    Scheduled Meds:  heparin   5,000 Units Subcutaneous Q8H   pantoprazole   40 mg Oral Daily   predniSONE   5 mg Oral Q breakfast   tacrolimus  ER  12 mg Oral Daily   Continuous Infusions:  Nutritional status     Body mass index is 28.21 kg/m.  Data Reviewed:   CBC: Recent Labs  Lab 07/09/24 1915 07/10/24 0555  WBC 3.4* 3.6*  HGB 12.2* 12.8*  HCT 38.6* 39.7  MCV 104.9* 103.7*  PLT 87* 90*   Basic Metabolic Panel: Recent Labs  Lab 07/09/24 1915 07/10/24 0555  NA 141 137  K 4.2 5.6*  CL 98 99  CO2 26 21*  GLUCOSE 122* 125*  BUN 45* 54*  CREATININE 8.19* 9.64*  CALCIUM  8.7* 9.1   GFR: Estimated Creatinine Clearance: 10 mL/min (A) (by C-G formula based on SCr of 9.64 mg/dL (H)). Liver Function Tests: Recent Labs  Lab 07/10/24 0555  AST 28  ALT 34  ALKPHOS 107  BILITOT 0.5  PROT 8.5*  ALBUMIN 4.1   No results for input(s): LIPASE, AMYLASE in the last 168 hours. No results for input(s): AMMONIA in the last 168 hours. Coagulation Profile: No results for input(s): INR, PROTIME in the last 168 hours. Cardiac Enzymes: No results for input(s): CKTOTAL, CKMB, CKMBINDEX, TROPONINI in the last 168 hours. BNP (last 3 results) No results for  input(s): PROBNP in the last 8760 hours. HbA1C: No results for input(s): HGBA1C in the last 72 hours. CBG: No results for input(s): GLUCAP in the last 168 hours. Lipid Profile: No results for input(s): CHOL, HDL, LDLCALC, TRIG, CHOLHDL, LDLDIRECT in the last 72 hours. Thyroid Function Tests: No results for input(s): TSH, T4TOTAL, FREET4, T3FREE, THYROIDAB in the last 72 hours. Anemia Panel: No results for input(s): VITAMINB12, FOLATE, FERRITIN, TIBC, IRON, RETICCTPCT in the last 72 hours. Sepsis Labs: No results for input(s): PROCALCITON, LATICACIDVEN in the last 168 hours.  No results found for this or any previous visit (from the past 240 hours).       Radiology Studies: No results found.         LOS: 0 days   Time spent= 35 mins    Burgess JAYSON Dare, MD [facility]  If 7PM-7AM, please contact night-coverage  07/10/2024, 10:15 AM  "

## 2024-07-10 NOTE — Progress Notes (Addendum)
" °  Daily Progress Note  S/p:n/a  Subjective: Feeling okay. 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 He has had multiple access surgeries in the past including a HeroGraft. We attempted to pre-treat his contrast allergy for a CTA but since he has lymphedema of his R arm we can only have an IV in the L which means a CTA of the LUE via a left arm IV would have too much artifact to obtain decent information. 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 I think this is likely a pseudoaneurysm from a graft disruption and given his multiple access surgeries on his left arm and overall condition an endovascular repair is probably best. Plan for a left arm angiogram in cathlab tomorrow with possible left brachial artery stenting.   He will be pretreated overnight tonight for his contrast allergy. NPO midnight   Norman GORMAN Serve MD [facility] [phone] 07/10/2024  12:40 PM  "

## 2024-07-10 NOTE — Progress Notes (Signed)
 Left upper extremity arterial duplex has been completed.  Results can be found in chart review under CV Proc.  07/10/2024 11:38 AM  Toriano Aikey Elden Appl, RVT.

## 2024-07-10 NOTE — Care Management Obs Status (Signed)
 MEDICARE OBSERVATION STATUS NOTIFICATION   Patient Details  Name: SULIMAN TERMINI MRN: [id number] Date of Birth: 1966-03-08   Medicare Observation Status Notification Given:  Yes    Debarah Saunas, RN 07/10/2024, 9:40 AM

## 2024-07-10 NOTE — Consult Note (Signed)
 Renal Service Consult Note [facility] Nicholas JONETTA Fret, MD  Patient: Nicholas Green Date: 07/10/2024 Requesting Physician: Dr Caleen  Reason for Consult: ESRD patient with left arm access pain HPI: The patient is a 59 y.o. year-old w/ PMH as below who presented to ED yesterday afternoon reporting pain over his old fistula in his left arm.  His providers at dialysis were concern for possible AVF aneurysm.  He had a full dialysis session yesterday.  He also has swelling to the right arm but this is his baseline.  In the ED BP was 137/96, HR 81, RR 19, temp 97.5, 100% O2 sat on room air.  Labs were K+ 4.2, BUN 45, creatinine 8.2, albumin 4.1.  WBC 3.4, Hgb 12.2.  Platelets 87,000.  Patient was given Dilaudid  IV, IV Solu-Medrol , and oxycodone  10 mg IR.  Hospitalist team was consulted for admission.  Nephrology consulted for ESRD.   Pt seen in hospital room.  Patient has no current complaints.  His left arm is a little achy.  He has a history of failed renal transplant and continues to take tacrolimus , prednisone  and mycophenolate .  His last dialysis was yesterday morning.   ROS - denies CP, no joint pain, no HA, no blurry vision, no rash, no diarrhea, no nausea/ vomiting   Past Medical History  Past Medical History:  Diagnosis Date   Chronic renal failure    S/P renal transplant   Condyloma    meatal condyloma    Dialysis patient    reports he gets dialysis every evening ; has graft in left upper extremity, shunt in right forearm and also has peritoneal access   Gout    Hypertension    Reflux    S/P kidney transplant    Stroke [facility])    SVT (supraventricular tachycardia)    SEE ED VISIT EPIC CARE EVERYWHERE 03-06-18 ; PATIENT HAD LOW PAOTASSIUM LEVELS, CARDIO CONSULT DONE AND UNDERWENT STRESS TEST AND ECHO . DENIES RECURENCE OF SX AT TODAY PRE-OP 04-29-18    Past Surgical History  Past Surgical History:  Procedure Laterality Date   AV FISTULA PLACEMENT     left arm    CYSTOSCOPY WITH BIOPSY N/A 05/07/2018   Procedure: CYSTOSCOPY WITH EXCISIONAL BIOPSY OF PENILE CONDYLOMA;  Surgeon: Watt Rush, MD;  Location: [facility];  Service: Urology;  Laterality: N/A;   HERNIA REPAIR     IR US  GUIDE VASC ACCESS RIGHT  07/20/2021   IR US  GUIDE VASC ACCESS RIGHT  07/20/2021   IR US  GUIDE VASC ACCESS RIGHT  07/20/2021   IR VENOCAVAGRAM SVC  07/20/2021   KIDNEY TRANSPLANT Right 2008   NASAL FRACTURE SURGERY     Family History History reviewed. No pertinent family history. Social History  reports that he has quit smoking. His smoking use included cigarettes. He has never used smokeless tobacco. He reports that he does not drink alcohol and does not use drugs. Allergies Allergies[1] Home medications Prior to Admission medications  Medication Sig Start Date End Date Taking? Authorizing Provider  apixaban (ELIQUIS) 5 MG TABS tablet Take 5 mg by mouth 2 (two) times daily.   Yes [provider]  gabapentin  (NEURONTIN ) 600 MG tablet Take 1 tablet by mouth in the morning, at noon, in the evening, and at bedtime. 05/21/24  Yes [provider]  multivitamin (RENA-VIT) TABS tablet Take 1 tablet by mouth daily. 06/25/24  Yes [provider]  omeprazole (PRILOSEC) 40 MG capsule Take 40 mg by mouth daily.  Yes [provider]  Oxycodone  HCl 10 MG TABS Take 10 mg by mouth in the morning, at noon, in the evening, and at bedtime. 06/13/24  Yes [provider]  predniSONE  (DELTASONE ) 5 MG tablet Take 5 mg by mouth daily with breakfast.   Yes [provider]  sevelamer carbonate (RENVELA) 800 MG tablet Take 1,600 mg by mouth 3 (three) times daily. Take two tablets (1600mg )  three times a day with meals. Take one tablet (800mg ) by mouth with snacks. 05/21/24  Yes [provider]  tacrolimus  ER (ENVARSUS  XR) 4 MG TB24 Take 4 mg by mouth daily. 3 tablets daily = 12 mg 05/04/20  Yes [provider]  tiZANidine (ZANAFLEX) 2 MG tablet Take  2 mg by mouth at bedtime. 06/04/24  Yes [provider]  Vitamin D, Ergocalciferol, 50 MCG (2000 UT) CAPS Take 2,000 Units by mouth in the morning.   Yes [provider]     Vitals:   07/10/24 0935 07/10/24 1102 07/10/24 1206 07/10/24 1609  BP:  (!) 185/103 (!) 134/97 (!) 163/94  Pulse:  73 76 76  Resp:  18 20 20   Temp: 97.7 F (36.5 C) (!) 97.3 F (36.3 C) (!) 97.4 F (36.3 C) 97.9 F (36.6 C)  TempSrc:  Oral    SpO2:  98% 95% 98%  Weight:      Height:       Exam Gen alert, no distress Sclera anicteric, throat clear  No jvd or bruits Chest clear bilat to bases RRR no MRG Abd soft ntnd no mass or ascites +bs Ext 2-3+ RUE edema (chronic), LUE pulsatile AVF Neuro is alert, Ox 3 , nf    TDC intact in chest  Home bp meds: None    OP HD: [facility] MWF 4h  B400  97.5kg  TDC  Heparin  2500 Has not missed HD Last OP HD 1/21, post wt 98.3kg Usually comes off 1-2kg over Dry wt raised 2 times in last 3 wks    Assessment/ Plan:  # ESRD - on HD MWF, last HD Wed - has not missed any HD - plan HD tomorrow, work around the procedure   # HTN - bp's wnl today - not on any bp lowered meds it appears at home    # Volume - chronic RUE edema - no real signs vol overload - UF 2- 3 L as tol   # Anemia of esrd - Hb 12-13 here, follow  # h/o failed renal transplant - cont home meds  # L AVF possible pseudoaneurysm - for angiogram and possible stenting procedure tomorrow per [facility]      [name]  MD [facility] 07/10/2024, 4:53 PM  Recent Labs  Lab 07/09/24 1915 07/10/24 0555  HGB 12.2* 12.8*  ALBUMIN  --  4.1  CALCIUM  8.7* 9.1  CREATININE 8.19* 9.64*  K 4.2 5.6*   Inpatient medications:  [START ON 07/11/2024] diphenhydrAMINE   50 mg Oral Once   heparin   5,000 Units Subcutaneous Q8H   pantoprazole   40 mg Oral Daily   predniSONE   5 mg Oral Q breakfast   predniSONE   50 mg Oral Q6H   tacrolimus  ER  12 mg Oral Daily    acetaminophen  **OR**  acetaminophen , guaiFENesin , hydrALAZINE , ipratropium-albuterol , metoprolol  tartrate, ondansetron  **OR** ondansetron  (ZOFRAN ) IV, oxycodone , senna-docusate      [1]  Allergies Allergen Reactions   Ivp Dye [Iodinated Contrast Media] Swelling   Metrizamide Swelling   Latex Itching and Dermatitis

## 2024-07-10 NOTE — Discharge Planning (Signed)
 Transition of Care [facility]) - Inpatient Brief Assessment   Patient Details  Name: Nicholas Green MRN: [id number] Date of Birth: [birth year]  [facility] CM/SW Contact:    Debarah Saunas, RN Phone Number: 07/10/2024, 9:43 AM   Clinical Narrative: Inpatient Care Management (ICM) has reviewed patient at bedside and no ICM needs have been identified at this time. We will continue to monitor patient advancement through interdisciplinary progression rounds. If new patient transition needs arise, please place a ICM consult.   Transition of Care Asessment: Insurance and Status: Insurance coverage has been reviewed Patient has primary care physician: Yes [name]) Home environment has been reviewed: from home (apartment) with 35 yr old son Prior level of function:: moderate assist, has access to DME ([employer], [employer]single point, Advice worker) and uses PRN Prior/Current Home Services: Current home services Social Drivers of Health Review: SDOH reviewed no interventions necessary Readmission risk has been reviewed: No Transition of care needs: no transition of care needs at this time

## 2024-07-10 NOTE — Plan of Care (Signed)
 Radiologist notified me that they cannot do CT angiogram of the left upper extremity due to IV access issues.  I spoke with Dr. Pearline on-call vascular surgeon.  Dr. Pearline requested getting arterial Doppler of the left upper extremity.  Redia Said MD.

## 2024-07-11 ENCOUNTER — Encounter (HOSPITAL_COMMUNITY): Admission: EM | Disposition: A | Payer: Self-pay | Source: Home / Self Care | Attending: Internal Medicine

## 2024-07-11 DIAGNOSIS — N186 End stage renal disease: Secondary | ICD-10-CM

## 2024-07-11 DIAGNOSIS — Z992 Dependence on renal dialysis: Secondary | ICD-10-CM

## 2024-07-11 DIAGNOSIS — I7789 Other specified disorders of arteries and arterioles: Secondary | ICD-10-CM

## 2024-07-11 DIAGNOSIS — Z9889 Other specified postprocedural states: Secondary | ICD-10-CM

## 2024-07-11 LAB — CBC
HCT: 38.8 % — ABNORMAL LOW (ref 39.0–52.0)
Hemoglobin: 12.4 g/dL — ABNORMAL LOW (ref 13.0–17.0)
MCH: 33.1 pg (ref 26.0–34.0)
MCHC: 32 g/dL (ref 30.0–36.0)
MCV: 103.5 fL — ABNORMAL HIGH (ref 80.0–100.0)
Platelets: 83 K/uL — ABNORMAL LOW (ref 150–400)
RBC: 3.75 MIL/uL — ABNORMAL LOW (ref 4.22–5.81)
RDW: 19.5 % — ABNORMAL HIGH (ref 11.5–15.5)
WBC: 4 K/uL (ref 4.0–10.5)
nRBC: 0 % (ref 0.0–0.2)

## 2024-07-11 LAB — BASIC METABOLIC PANEL WITH GFR
Anion gap: 18 — ABNORMAL HIGH (ref 5–15)
BUN: 83 mg/dL — ABNORMAL HIGH (ref 6–20)
CO2: 20 mmol/L — ABNORMAL LOW (ref 22–32)
Calcium: 8.7 mg/dL — ABNORMAL LOW (ref 8.9–10.3)
Chloride: 98 mmol/L (ref 98–111)
Creatinine, Ser: 12 mg/dL — ABNORMAL HIGH (ref 0.61–1.24)
GFR, Estimated: 4 mL/min — ABNORMAL LOW
Glucose, Bld: 143 mg/dL — ABNORMAL HIGH (ref 70–99)
Potassium: 6.2 mmol/L — ABNORMAL HIGH (ref 3.5–5.1)
Sodium: 136 mmol/L (ref 135–145)

## 2024-07-11 LAB — HEPATITIS B SURFACE ANTIGEN: Hepatitis B Surface Ag: NONREACTIVE

## 2024-07-11 MED ORDER — SODIUM CHLORIDE 0.9% FLUSH
3.0000 mL | Freq: Two times a day (BID) | INTRAVENOUS | Status: DC
  Administered 2024-07-11 – 2024-07-12 (×3): 3 mL via INTRAVENOUS

## 2024-07-11 MED ORDER — SODIUM ZIRCONIUM CYCLOSILICATE 10 G PO PACK
10.0000 g | PACK | Freq: Three times a day (TID) | ORAL | Status: AC
  Administered 2024-07-11 (×3): 10 g via ORAL
  Filled 2024-07-11 (×3): qty 1

## 2024-07-11 MED ORDER — LABETALOL HCL 5 MG/ML IV SOLN: 10.0000 mg | INTRAVENOUS | Status: DC | PRN

## 2024-07-11 MED ORDER — LIDOCAINE HCL (PF) 1 % IJ SOLN
INTRAMUSCULAR | Status: DC | PRN
  Administered 2024-07-11: 15 mL

## 2024-07-11 MED ORDER — IODIXANOL 320 MG/ML IV SOLN
INTRAVENOUS | Status: DC | PRN
  Administered 2024-07-11: 35 mL

## 2024-07-11 MED ORDER — HEPARIN SODIUM (PORCINE) 1000 UNIT/ML IJ SOLN
INTRAMUSCULAR | Status: AC
  Filled 2024-07-11: qty 3

## 2024-07-11 MED ORDER — SODIUM CHLORIDE 0.9% FLUSH: 3.0000 mL | INTRAVENOUS | Status: DC | PRN

## 2024-07-11 MED ORDER — HEPARIN SODIUM (PORCINE) 1000 UNIT/ML DIALYSIS
1000.0000 [IU] | INTRAMUSCULAR | Status: AC | PRN
  Administered 2024-07-11: 1000 [IU] via INTRAVENOUS_CENTRAL

## 2024-07-11 MED ORDER — MIDAZOLAM HCL (PF) 2 MG/2ML IJ SOLN
INTRAMUSCULAR | Status: DC | PRN
  Administered 2024-07-11: 2 mg via INTRAVENOUS

## 2024-07-11 MED ORDER — HYDROMORPHONE HCL 1 MG/ML IJ SOLN
0.5000 mg | INTRAMUSCULAR | Status: DC | PRN
  Administered 2024-07-11: 0.5 mg via INTRAVENOUS
  Filled 2024-07-11: qty 1

## 2024-07-11 MED ORDER — HYDRALAZINE HCL 20 MG/ML IJ SOLN: 5.0000 mg | INTRAMUSCULAR | Status: DC | PRN

## 2024-07-11 MED ORDER — HEPARIN SODIUM (PORCINE) 1000 UNIT/ML IJ SOLN
4200.0000 [IU] | Freq: Once | INTRAMUSCULAR | Status: AC
  Administered 2024-07-11: 4200 [IU]

## 2024-07-11 MED ORDER — FENTANYL CITRATE (PF) 100 MCG/2ML IJ SOLN
INTRAMUSCULAR | Status: AC
  Filled 2024-07-11: qty 2

## 2024-07-11 MED ORDER — MIDAZOLAM HCL 2 MG/2ML IJ SOLN
INTRAMUSCULAR | Status: AC
  Filled 2024-07-11: qty 2

## 2024-07-11 MED ORDER — FENTANYL CITRATE (PF) 100 MCG/2ML IJ SOLN
INTRAMUSCULAR | Status: DC | PRN
  Administered 2024-07-11: 50 ug via INTRAVENOUS

## 2024-07-11 MED ORDER — HEPARIN SODIUM (PORCINE) 1000 UNIT/ML IJ SOLN
INTRAMUSCULAR | Status: AC
  Filled 2024-07-11: qty 5

## 2024-07-11 MED ORDER — HEPARIN SODIUM (PORCINE) 1000 UNIT/ML DIALYSIS
2000.0000 [IU] | Freq: Once | INTRAMUSCULAR | Status: AC
  Administered 2024-07-11: 2000 [IU] via INTRAVENOUS_CENTRAL

## 2024-07-11 MED ORDER — SODIUM CHLORIDE 0.9 % IV SOLN: 250.0000 mL | INTRAVENOUS | Status: DC | PRN

## 2024-07-11 MED ORDER — LIDOCAINE HCL (PF) 1 % IJ SOLN
INTRAMUSCULAR | Status: AC
  Filled 2024-07-11: qty 30

## 2024-07-11 MED ORDER — TACROLIMUS ER 4 MG PO TB24
4.0000 mg | ORAL_TABLET | Freq: Every day | ORAL | Status: DC
  Administered 2024-07-12: 4 mg via ORAL
  Filled 2024-07-11: qty 1

## 2024-07-11 MED ORDER — HEPARIN (PORCINE) IN NACL 2000-0.9 UNIT/L-% IV SOLN
INTRAVENOUS | Status: DC | PRN
  Administered 2024-07-11: 1000 mL

## 2024-07-11 NOTE — Plan of Care (Signed)

## 2024-07-11 NOTE — Op Note (Signed)
" ° ° °  Patient name: Nicholas Green MRN: [id number] DOB: 30-Jun-1965 Sex: male  07/11/2024 Pre-operative Diagnosis: ESRD Post-operative diagnosis:  Same Surgeon:  Malvina New Procedure Performed:  1.  U/s guided access, right femoral artery  2.  Aortic arch angiogram  3.  Left upper extremity angiogram  4.  Selective injection with catheter in left brachial artery  5.  Conscious sedation, 24 minutes  6.  Closure device, Celt     Indications: This is a 59 year old gentleman with end-stage renal disease who has had multiple access grafts and has developed a pseudoaneurysm in the distal left brachial artery by ultrasound.  He comes in today for angiographic evaluation and possible treatment.  Procedure:  The patient was identified in the holding area and taken to room 8.  The patient was then placed supine on the table and prepped and draped in the usual sterile fashion.  A time out was called.  Conscious sedation was administered with the use of IV fentanyl  and Versed  under continuous physician and nurse monitoring.  Heart rate, blood pressure, and oxygen saturation were continuously monitored.  Total sedation time was 24 minutes.  Ultrasound was used to evaluate the right common femoral artery.  It was patent .  A digital ultrasound image was acquired.  A micropuncture needle was used to access the right common femoral artery under ultrasound guidance.  An 018 wire was advanced without resistance and a micropuncture sheath was placed.  The 018 wire was removed and a benson wire was placed.  The micropuncture sheath was exchanged for a 5 french sheath.  The pigtail catheter was advanced into the ascending aorta and an aortic arch angiogram was performed.  Next using a Berenstein 2 catheter Glidewire manage the left subclavian artery was selected.  The cath was advanced into the left brachial artery and left upper arm angiogram was performed.  The groin was closed with a Celt   Findings:   Aortic  arch: A type I aortic arch was identified with no significant great vessel stenosis.  Possible bovine configuration however this was not fully defined.  Left upper extremity: The left subclavian, left axillary and brachial artery are widely patent.  The brachial artery is ectatic down to the antecubital crease.  It narrows down at this level with possible stenosis.  There is single-vessel runoff via the ulnar artery.  No obvious pseudoaneurysm is identified   Impression:  #1  No obvious pseudoaneurysm identified in the left brachial artery   V. Malvina New, M.D., [facility] [place] Office: [phone] Pager:  [phone]  "

## 2024-07-11 NOTE — Progress Notes (Addendum)
 Pt receives out-pt HD at [facility], MWF 0945am chair time. Will continue to assist as needed.    Lavanda Khaliah Barnick  Dialysis Navigator [phone]

## 2024-07-11 NOTE — Progress Notes (Addendum)
 "    Progress Note    Nicholas Green  FMW:[id number] DOB: 01-Mar-1966  DOA: 07/09/2024 PCP: Catalina Bare, MD      Brief Narrative:    Medical records reviewed and are as summarized below:  Nicholas Green is a 59 y.o. male ith history of ESRD on HD MWF, prior failed kidney transplant, CVA comes to the hospital with left arm pain where his old dialysis access was.  Dialysis team at the outpatient dialysis center was concerned about possible AVF aneurysm.  Vascular team was consulted for pulsatile mass in the left arm and was asked to admit.        Assessment/Plan:   Principal Problem:   Dialysis AV fistula malfunction    Body mass index is 28.21 kg/m.   Left arm pain/antecubital pulsatile mass: S/p left upper extremity angiogram on 07/11/2024 by vascular surgeon.  No evidence of pseudoaneurysm was found. Analgesics as needed for pain.  IV Dilaudid  ordered for complaint of 10 out of 10 pain in the left upper extremity.   ESRD on HD: Plan for hemodialysis today. Hyperkalemia: Treat with Lokelma .  Plan for hemodialysis today.  Discussed with Dr. Geralynn, nephrologist.   History of renal transplant: Continue tacrolimus , low-dose prednisone  and mycophenolate .  Comorbidities include chronic thrombocytopenia, chronic right upper extremity lymphedema   Diet Order             Diet renal with fluid restriction Fluid restriction: 1200 mL Fluid; Room service appropriate? Yes; Fluid consistency: Thin  Diet effective now                                  Consultants: Nephrologist Vascular surgeon  Procedures: Left upper extremity angiogram on 07/11/2024    Medications:    Chlorhexidine  Gluconate Cloth  6 each Topical Q0600   heparin   5,000 Units Subcutaneous Q8H   pantoprazole   40 mg Oral Daily   predniSONE   5 mg Oral Q breakfast   sodium chloride  flush  3 mL Intravenous Q12H   sodium zirconium cyclosilicate   10 g Oral TID   [START ON  07/12/2024] tacrolimus  ER  4 mg Oral Daily   Continuous Infusions:  sodium chloride        Anti-infectives (From admission, onward)    None              Family Communication/Anticipated D/C date and plan/Code Status   DVT prophylaxis: heparin  injection 5,000 Units Start: 07/09/24 2330     Code Status: Full Code  Family Communication: None Disposition Plan: Plan to discharge home tomorrow   Status is: Inpatient Remains inpatient appropriate because: Hyperkalemia       Subjective:   Interval events noted.  He complains of severe pain in the left arm.  Catrina, RN, at the bedside.  Objective:    Vitals:   07/11/24 1315 07/11/24 1345 07/11/24 1400 07/11/24 1442  BP: (!) 183/96 (!) 183/56 (!) 148/57 106/74  Pulse:  66 65 66  Resp: 17 13 17 16   Temp:    (!) 97.4 F (36.3 C)  TempSrc:    Oral  SpO2:  99% 100% 100%  Weight:      Height:       No data found.  No intake or output data in the 24 hours ending 07/11/24 1512 Filed Weights   07/09/24 1619  Weight: 97 kg    Exam:  GEN: NAD SKIN: Warm and dry.  Permacath on left upper chest EYES: No pallor or icterus ENT: MMM CV: RRR PULM: CTA B ABD: soft, ND, NT, +BS CNS: AAO x 3, non focal EXT: Right upper extremity lymphedema        Data Reviewed:   I have personally reviewed following labs and imaging studies:  Labs: Labs show the following:   Basic Metabolic Panel: Recent Labs  Lab 07/09/24 1915 07/10/24 0555 07/11/24 0700  NA 141 137 136  K 4.2 5.6* 6.2*  CL 98 99 98  CO2 26 21* 20*  GLUCOSE 122* 125* 143*  BUN 45* 54* 83*  CREATININE 8.19* 9.64* 12.00*  CALCIUM  8.7* 9.1 8.7*   GFR Estimated Creatinine Clearance: 8 mL/min (A) (by C-G formula based on SCr of 12 mg/dL (H)). Liver Function Tests: Recent Labs  Lab 07/10/24 0555  AST 28  ALT 34  ALKPHOS 107  BILITOT 0.5  PROT 8.5*  ALBUMIN 4.1   No results for input(s): LIPASE, AMYLASE in the last 168 hours. No  results for input(s): AMMONIA in the last 168 hours. Coagulation profile No results for input(s): INR, PROTIME in the last 168 hours.  CBC: Recent Labs  Lab 07/09/24 1915 07/10/24 0555 07/11/24 0700  WBC 3.4* 3.6* 4.0  HGB 12.2* 12.8* 12.4*  HCT 38.6* 39.7 38.8*  MCV 104.9* 103.7* 103.5*  PLT 87* 90* 83*   Cardiac Enzymes: No results for input(s): CKTOTAL, CKMB, CKMBINDEX, TROPONINI in the last 168 hours. BNP (last 3 results) No results for input(s): PROBNP in the last 8760 hours. CBG: Recent Labs  Lab 07/10/24 1206 07/10/24 1609  GLUCAP 79 117*   D-Dimer: No results for input(s): DDIMER in the last 72 hours. Hgb A1c: No results for input(s): HGBA1C in the last 72 hours. Lipid Profile: No results for input(s): CHOL, HDL, LDLCALC, TRIG, CHOLHDL, LDLDIRECT in the last 72 hours. Thyroid function studies: No results for input(s): TSH, T4TOTAL, T3FREE, THYROIDAB in the last 72 hours.  Invalid input(s): FREET3 Anemia work up: No results for input(s): VITAMINB12, FOLATE, FERRITIN, TIBC, IRON, RETICCTPCT in the last 72 hours. Sepsis Labs: Recent Labs  Lab 07/09/24 1915 07/10/24 0555 07/11/24 0700  WBC 3.4* 3.6* 4.0    Microbiology No results found for this or any previous visit (from the past 240 hours).  Procedures and diagnostic studies:  PERIPHERAL VASCULAR CATHETERIZATION Result Date: 07/11/2024 Images from the original result were not included.   Patient name: Nicholas Green        MRN: [id number]        DOB: 1965/11/04          Sex: male  07/11/2024 Pre-operative Diagnosis: ESRD Post-operative diagnosis:  Same Surgeon:  Malvina New Procedure Performed:            1.  U/s guided access, right femoral artery            2.  Aortic arch angiogram            3.  Left upper extremity angiogram            4.  Selective injection with catheter in left brachial artery            5.  Conscious sedation, 24 minutes             6.  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  He comes in today for angiographic evaluation and possible treatment.  Procedure:  The patient was identified in the holding area and taken to room 8.  The patient was then placed supine on the table and prepped and draped in the usual sterile fashion.  A time out was called.  Conscious sedation was administered with the use of IV fentanyl  and Versed  under continuous physician and nurse monitoring.  Heart rate, blood pressure, and oxygen saturation were continuously monitored.  Total sedation time was 24 minutes.  Ultrasound was used to evaluate the right common femoral artery.  It was patent .  A digital ultrasound image was acquired.  A micropuncture needle was used to access the right common femoral artery under ultrasound guidance.  An 018 wire was advanced without resistance and a micropuncture sheath was placed.  The 018 wire was removed and a benson wire was placed.  The micropuncture sheath was exchanged for a 5 french sheath.  The pigtail catheter was advanced into the ascending aorta and an aortic arch angiogram was performed.  Next using a Berenstein 2 catheter Glidewire manage the left subclavian artery was selected.  The cath was advanced into the left brachial artery and left upper arm angiogram was performed.  The groin was closed with a Celt   Findings:            Aortic arch: A type I aortic arch was identified with no significant great vessel stenosis.  Possible bovine configuration however this was not fully defined.             Left upper extremity: The left subclavian, left axillary and brachial artery are widely patent.  The brachial artery is ectatic down to the antecubital crease.  It narrows down at this level with possible stenosis.  There is single-vessel  runoff via the ulnar artery.  No obvious pseudoaneurysm is identified   Impression:             #1  No obvious pseudoaneurysm identified in the left brachial artery   V. Malvina New, M.D., [facility] [place] Office: [phone] Pager:  [phone]   VAS US  UPPER EXTREMITY ARTERIAL DUPLEX Result Date: 07/10/2024  UPPER EXTREMITY DUPLEX STUDY Patient Name:  Nicholas Green  Date of Exam:   07/10/2024 Medical Rec #: [id number]         Accession #:    [id number] Date of Birth: [birth year]         Patient Gender: M Patient Age:   51 years Exam Location:  [facility] Procedure:      VAS US  UPPER EXTREMITY ARTERIAL DUPLEX Referring Phys: REDIA CLEAVER --------------------------------------------------------------------------------  Indications: Palpable knot, bruit and pain, rule out aneurysm. History:     Patient has a history of upper extremity pain.  Risk Factors:  Hypertension, past history of smoking, prior CVA. Other Factors: ESRD on HD Limitations: Multiple occluded dialysis grafts noted throughout the upper              extremity, Comparison Study: No prior exam. Performing Technologist: Edilia Elden Appl  Examination Guidelines: A complete evaluation includes B-mode imaging, spectral Doppler, color Doppler, and power Doppler as needed of all accessible portions of each vessel. Bilateral testing is considered an integral part of a complete examination. Limited examinations for reoccurring indications may be performed as noted.  Left Doppler Findings: +---------------+----------+--------+--------+----------+ Site  PSV (cm/s)WaveformStenosisComments   +---------------+----------+--------+--------+----------+ Subclavian Mid 43        biphasic                   +---------------+----------+--------+--------+----------+ Subclavian Dist55        biphasic                   +---------------+----------+--------+--------+----------+ Axillary       50         biphasic                   +---------------+----------+--------+--------+----------+ Brachial Prox  41        biphasic                   +---------------+----------+--------+--------+----------+ Brachial Mid   29        biphasic                   +---------------+----------+--------+--------+----------+ Brachial Dist  87        biphasic        Aneurysmal +---------------+----------+--------+--------+----------+ Radial Prox    39        biphasic                   +---------------+----------+--------+--------+----------+ Radial Mid     42        biphasic                   +---------------+----------+--------+--------+----------+ Radial Dist    17        biphasic        Low flow   +---------------+----------+--------+--------+----------+ Ulnar Prox     60        biphasic                   +---------------+----------+--------+--------+----------+ Ulnar Mid      66        biphasic                   +---------------+----------+--------+--------+----------+ Ulnar Dist     67        biphasic                   +---------------+----------+--------+--------+----------+ 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 *See table(s) above for measurements and observations. Electronically signed by Penne Colorado MD on 07/10/2024 at 6:13:47 PM.    Final                LOS: 1 day   Sheray Grist  [facility]   Pager on [email or web address]. If 7PM-7AM, please contact night-coverage at [email or web address]     07/11/2024, 3:12 PM           "

## 2024-07-11 NOTE — Progress Notes (Signed)
" °   07/11/24 2039  Vitals  Temp 98.2 F (36.8 C)  Pulse Rate 92  Resp 16  BP 106/65  SpO2 94 %  O2 Device Room Air  Weight (S)  97.9 kg (Bed Scale)  Oxygen Therapy  Patient Activity (if Appropriate) In bed  Post Treatment  Dialyzer Clearance Clear  Hemodialysis Intake (mL) 0 mL  Liters Processed 72  Fluid Removed (mL) 2500 mL  Tolerated HD Treatment Yes  Post-Hemodialysis Comments Pt. SBP decrease in the last 10 mins of hemodialysis treatment, UF off and gave NS bolus. UF goal was maintained. Report call to 6E bedside RN   Received patient in bed to unit.  Alert and oriented.  Informed consent signed and in chart.   TX duration: 3  Patient tolerated well.  Transported back to the room  Alert, without acute distress.  Hand-off given to patient's nurse.   Access used: Yes Access issues: No  Total UF removed: 2500 Medication(s) given: See MAR Post HD VS: See Above Grid Post HD weight: 97.9 kg   [facility] "

## 2024-07-11 NOTE — Progress Notes (Signed)
 " [facility] Progress Note   Subjective:   Seen in room. Feels ok. No dyspnea/CP. For angiogram/stenting today with VVS, followed by dialysis. K high this AM, has been given Lokelma already.  Objective Vitals:   07/10/24 2013 07/11/24 0024 07/11/24 0343 07/11/24 0810  BP: 106/84 (!) 141/82 (!) 157/73 (!) 151/90  Pulse: 78 72 71 69  Resp: 20 20 20 20   Temp: (!) 97.3 F (36.3 C) (!) 97.3 F (36.3 C) (!) 97.3 F (36.3 C) 97.6 F (36.4 C)  TempSrc:   Oral Oral  SpO2: 100% 100% 99% 98%  Weight:      Height:       Physical Exam General: Well appearing, NAD. Room air Heart: RRR Lungs: CTAB Abdomen: soft Extremities: no LE edema, chronic RUE lymphedema. L antecubital fossa with tender, pulsatile mass Dialysis Access: TDC in L chest  Additional Objective Labs: Basic Metabolic Panel: Recent Labs  Lab 07/09/24 1915 07/10/24 0555 07/11/24 0700  NA 141 137 136  K 4.2 5.6* 6.2*  CL 98 99 98  CO2 26 21* 20*  GLUCOSE 122* 125* 143*  BUN 45* 54* 83*  CREATININE 8.19* 9.64* 12.00*  CALCIUM  8.7* 9.1 8.7*   Liver Function Tests: Recent Labs  Lab 07/10/24 0555  AST 28  ALT 34  ALKPHOS 107  BILITOT 0.5  PROT 8.5*  ALBUMIN 4.1   CBC: Recent Labs  Lab 07/09/24 1915 07/10/24 0555 07/11/24 0700  WBC 3.4* 3.6* 4.0  HGB 12.2* 12.8* 12.4*  HCT 38.6* 39.7 38.8*  MCV 104.9* 103.7* 103.5*  PLT 87* 90* 83*   Studies/Results: VAS US  UPPER EXTREMITY ARTERIAL DUPLEX Result Date: 07/10/2024  UPPER EXTREMITY DUPLEX STUDY Patient Name:  Nicholas Green  Date of Exam:   07/10/2024 Medical Rec #: [id number]         Accession #:    [id number] Date of Birth: Oct 27, 1965         Patient Gender: M Patient Age:   59 years Exam Location:  [facility] Procedure:      VAS US  UPPER EXTREMITY ARTERIAL DUPLEX Referring Phys: REDIA CLEAVER --------------------------------------------------------------------------------  Indications: Palpable knot, bruit and pain, rule out  aneurysm. History:     Patient has a history of upper extremity pain.  Risk Factors:  Hypertension, past history of smoking, prior CVA. Other Factors: ESRD on HD Limitations: Multiple occluded dialysis grafts noted throughout the upper              extremity, Comparison Study: No prior exam. Performing Technologist: Edilia Elden Appl  Examination Guidelines: A complete evaluation includes B-mode imaging, spectral Doppler, color Doppler, and power Doppler as needed of all accessible portions of each vessel. Bilateral testing is considered an integral part of a complete examination. Limited examinations for reoccurring indications may be performed as noted.  Left Doppler Findings: +---------------+----------+--------+--------+----------+ Site           PSV (cm/s)WaveformStenosisComments   +---------------+----------+--------+--------+----------+ Subclavian Mid 43        biphasic                   +---------------+----------+--------+--------+----------+ Subclavian Dist55        biphasic                   +---------------+----------+--------+--------+----------+ Axillary       50        biphasic                   +---------------+----------+--------+--------+----------+  Brachial Prox  41        biphasic                   +---------------+----------+--------+--------+----------+ Brachial Mid   29        biphasic                   +---------------+----------+--------+--------+----------+ Brachial Dist  87        biphasic        Aneurysmal +---------------+----------+--------+--------+----------+ Radial Prox    39        biphasic                   +---------------+----------+--------+--------+----------+ Radial Mid     42        biphasic                   +---------------+----------+--------+--------+----------+ Radial Dist    17        biphasic        Low flow   +---------------+----------+--------+--------+----------+ Ulnar Prox     60        biphasic                    +---------------+----------+--------+--------+----------+ Ulnar Mid      66        biphasic                   +---------------+----------+--------+--------+----------+ Ulnar Dist     67        biphasic                   +---------------+----------+--------+--------+----------+ 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 *See table(s) above for measurements and observations. Electronically signed by Penne Colorado MD on 07/10/2024 at 6:13:47 PM.    Final    Medications:   Chlorhexidine  Gluconate Cloth  6 each Topical Q0600   diphenhydrAMINE   50 mg Oral Once   heparin   5,000 Units Subcutaneous Q8H   pantoprazole   40 mg Oral Daily   predniSONE   5 mg Oral Q breakfast   predniSONE   50 mg Oral Q6H   sodium zirconium cyclosilicate  10 g Oral TID   tacrolimus  ER  12 mg Oral Daily    Dialysis Orders [facility] MWF 4h  B400  97.5kg  TDC  Heparin  2500 Last OP HD 1/21, post wt 98.3kg Dry wt raised 2 times in last 3 wks      Assessment/ Plan:   # ESRD - on HD MWF - HD today after procedure  # L AVF pseudoaneurysm - for angio/possible stenting today  # hyperkalemia - S/p Lokelma this AM, for HD later to correct    # HTN/volume - BP high - continue home meds, current EDW for now  # Anemia of esrd - Hb 12-13 here, follow, no ESA needs  # Secondary HPTH - Ca ok, continue home meds   # h/o failed renal transplant - Current home meds are pred 5mg , Envarsus  4mg  daily - adjust   # Hx recurrent DVT - on Eliquis at home, on hold here for procedure   [name], PA-C 07/11/2024, 9:51 AM  [facility]    "

## 2024-07-12 ENCOUNTER — Other Ambulatory Visit (HOSPITAL_COMMUNITY): Payer: Self-pay

## 2024-07-12 LAB — BASIC METABOLIC PANEL WITH GFR
Anion gap: 19 — ABNORMAL HIGH (ref 5–15)
BUN: 66 mg/dL — ABNORMAL HIGH (ref 6–20)
CO2: 23 mmol/L (ref 22–32)
Calcium: 8.3 mg/dL — ABNORMAL LOW (ref 8.9–10.3)
Chloride: 96 mmol/L — ABNORMAL LOW (ref 98–111)
Creatinine, Ser: 9.26 mg/dL — ABNORMAL HIGH (ref 0.61–1.24)
GFR, Estimated: 6 mL/min — ABNORMAL LOW
Glucose, Bld: 210 mg/dL — ABNORMAL HIGH (ref 70–99)
Potassium: 4.4 mmol/L (ref 3.5–5.1)
Sodium: 138 mmol/L (ref 135–145)

## 2024-07-12 LAB — LIPID PANEL
Cholesterol: 163 mg/dL (ref 0–200)
HDL: 47 mg/dL
LDL Cholesterol: 49 mg/dL (ref 0–99)
Total CHOL/HDL Ratio: 3.5 ratio
Triglycerides: 333 mg/dL — ABNORMAL HIGH
VLDL: 67 mg/dL — ABNORMAL HIGH (ref 0–40)

## 2024-07-12 LAB — CBC
HCT: 38.2 % — ABNORMAL LOW (ref 39.0–52.0)
Hemoglobin: 12.4 g/dL — ABNORMAL LOW (ref 13.0–17.0)
MCH: 33.2 pg (ref 26.0–34.0)
MCHC: 32.5 g/dL (ref 30.0–36.0)
MCV: 102.4 fL — ABNORMAL HIGH (ref 80.0–100.0)
Platelets: 107 10*3/uL — ABNORMAL LOW (ref 150–400)
RBC: 3.73 MIL/uL — ABNORMAL LOW (ref 4.22–5.81)
RDW: 19.4 % — ABNORMAL HIGH (ref 11.5–15.5)
WBC: 4.9 10*3/uL (ref 4.0–10.5)
nRBC: 0 % (ref 0.0–0.2)

## 2024-07-12 MED ORDER — GABAPENTIN 600 MG PO TABS
300.0000 mg | ORAL_TABLET | Freq: Three times a day (TID) | ORAL | 0 refills | Status: DC
  Filled 2024-07-12: qty 90, 60d supply, fill #0

## 2024-07-12 MED ORDER — GABAPENTIN 600 MG PO TABS
ORAL_TABLET | ORAL | 0 refills | Status: AC
  Filled 2024-07-12: qty 90, 30d supply, fill #0

## 2024-07-12 NOTE — Plan of Care (Signed)

## 2024-07-12 NOTE — Progress Notes (Signed)
 DISCHARGE NOTE HOME HARRIS PENTON to be discharged Home per MD order. Discussed prescriptions and follow up appointments with the patient. Prescriptions given to patient; medication list explained in detail. Patient verbalized understanding.  Skin clean, dry and intact without evidence of skin break down, no evidence of skin tears noted. IV catheter discontinued intact. Site without signs and symptoms of complications. Dressing and pressure applied. Pt denies pain at the site currently. No complaints noted.  See Lda for HD cath Patient free of lines, drains, and wounds.   An After Visit Summary (AVS) was printed and given to the patient. Patient escorted via wheelchair, and discharged home via private auto.  Peyton SHAUNNA Pepper, RN

## 2024-07-12 NOTE — Discharge Planning (Signed)
 [facility] Patient Discharge Orders - [facility] CLINIC: [place]  Patient's name: Nicholas Green Admit/DC Dates: 07/09/2024 - 07/12/24  DISCHARGE DIAGNOSES: L antecubital mass - initial US  concerning for pseudoaneurysm, not visualized on angiogram  Hyperkalemia   HD ORDER CHANGES: Heparin  change: no EDW Change: no Bath Change: no  ANEMIA MANAGEMENT: Aranesp: Given: no  ESA dose for discharge: None until Hgb < 11 IV Iron dose at discharge: Per protocol Transfusion: Given: no  BONE/MINERAL MEDICATIONS: Hectorol/Calcitriol change: no Sensipar/Parsabiv change: no  ACCESS INTERVENTION/CHANGE: no Details: Angiogram without aneurysm/pseudoaneurysm   RECENT LABS: Recent Labs  Lab 07/10/24 0555 07/11/24 0700 07/12/24 0435  HGB 12.8*   < > 12.4*  NA 137   < > 138  K 5.6*   < > 4.4  CALCIUM  9.1   < > 8.3*  ALBUMIN 4.1  --   --    < > = values in this interval not displayed.    IV ANTIBIOTICS: no Details:  OTHER ANTICOAGULATION: yes Details: on Eliquis  OTHER/APPTS/LAB ORDERS: - Apparently was on large gabapentin  dose (600mg  QID), reduced to 300mg  q HD only  D/C Meds to be reconciled by nurse after every discharge.  Completed By: [name], PA-C [facility] Pager [phone]   Reviewed by: MD:______ RN_______

## 2024-07-12 NOTE — Progress Notes (Signed)
 " [name] Progress Note   Subjective:   Seen in room. Finished HD late yesterday so stayed overnight. Assuming will be discharged today. No CP/dyspnea.  Objective Vitals:   07/11/24 2334 07/12/24 0000 07/12/24 0433 07/12/24 0752  BP: (!) 100/50 98/60 (!) 112/53 115/72  Pulse: 85 82 85   Resp: 16 18 20 18   Temp: 98.5 F (36.9 C) (!) 97.5 F (36.4 C) 97.9 F (36.6 C) 97.8 F (36.6 C)  TempSrc: Oral Axillary Oral Oral  SpO2: 98% 98% 98%   Weight:      Height:       Physical Exam General: Well appearing, NAD. Room air Heart: RRR Lungs: CTAB Abdomen: soft Extremities: no LE edema, chronic RUE lymphedema. L antecubital fossa with tender, pulsatile mass Dialysis Access: TDC in L chest  Additional Objective Labs: Basic Metabolic Panel: Recent Labs  Lab 07/10/24 0555 07/11/24 0700 07/12/24 0435  NA 137 136 138  K 5.6* 6.2* 4.4  CL 99 98 96*  CO2 21* 20* 23  GLUCOSE 125* 143* 210*  BUN 54* 83* 66*  CREATININE 9.64* 12.00* 9.26*  CALCIUM  9.1 8.7* 8.3*   Liver Function Tests: Recent Labs  Lab 07/10/24 0555  AST 28  ALT 34  ALKPHOS 107  BILITOT 0.5  PROT 8.5*  ALBUMIN 4.1   CBC: Recent Labs  Lab 07/09/24 1915 07/10/24 0555 07/11/24 0700 07/12/24 0435  WBC 3.4* 3.6* 4.0 4.9  HGB 12.2* 12.8* 12.4* 12.4*  HCT 38.6* 39.7 38.8* 38.2*  MCV 104.9* 103.7* 103.5* 102.4*  PLT 87* 90* 83* 107*   Studies/Results: PERIPHERAL VASCULAR CATHETERIZATION Result Date: 07/11/2024 Images from the original result were not included.   Patient name: Nicholas Green        MRN: [id number]        DOB: 11-29-1965          Sex: male  07/11/2024 Pre-operative Diagnosis: ESRD Post-operative diagnosis:  Same Surgeon:  Malvina New Procedure Performed:            1.  U/s guided access, right femoral artery            2.  Aortic arch angiogram            3.  Left upper extremity angiogram            4.  Selective injection with catheter in left brachial artery             5.  Conscious sedation, 24 minutes            6.  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  He comes in today for angiographic evaluation and possible treatment.  Procedure:  The patient was identified in the holding area and taken to room 8.  The patient was then placed supine on the table and prepped and draped in the usual sterile fashion.  A time out was called.  Conscious sedation was administered with the use of IV fentanyl  and Versed  under continuous physician and nurse monitoring.  Heart rate, blood pressure, and oxygen saturation were continuously monitored.  Total sedation time was 24 minutes.  Ultrasound was used to evaluate the right common femoral artery.  It was patent .  A digital ultrasound image was acquired.  A micropuncture needle was used to access the right common femoral artery under ultrasound guidance.  An 018 wire was advanced without resistance and a micropuncture sheath was placed.  The 018 wire was removed and a benson wire was placed.  The micropuncture sheath was exchanged for a 5 french sheath.  The pigtail catheter was advanced into the ascending aorta and an aortic arch angiogram was performed.  Next using a Berenstein 2 catheter Glidewire manage the left subclavian artery was selected.  The cath was advanced into the left brachial artery and left upper arm angiogram was performed.  The groin was closed with a Celt   Findings:            Aortic arch: A type I aortic arch was identified with no significant great vessel stenosis.  Possible bovine configuration however this was not fully defined.             Left upper extremity: The left subclavian, left axillary and brachial artery are widely patent.  The brachial artery is ectatic down to the antecubital crease.  It narrows down at this level with  possible stenosis.  There is single-vessel runoff via the ulnar artery.  No obvious pseudoaneurysm is identified   Impression:             #1  No obvious pseudoaneurysm identified in the left brachial artery   V. Malvina New, M.D., [facility] [place] Office: [phone] Pager:  [phone]   VAS US  UPPER EXTREMITY ARTERIAL DUPLEX Result Date: 07/10/2024  UPPER EXTREMITY DUPLEX STUDY Patient Name:  Nicholas Green  Date of Exam:   07/10/2024 Medical Rec #: [id number]         Accession #:    [id number] Date of Birth: 16-Jun-1966         Patient Gender: M Patient Age:   59 years Exam [facility]:  [facility] Procedure:      VAS US  UPPER EXTREMITY ARTERIAL DUPLEX Referring Phys: REDIA CLEAVER --------------------------------------------------------------------------------  Indications: Palpable knot, bruit and pain, rule out aneurysm. History:     Patient has a history of upper extremity pain.  Risk Factors:  Hypertension, past history of smoking, prior CVA. Other Factors: ESRD on HD Limitations: Multiple occluded dialysis grafts noted throughout the upper              extremity, Comparison Study: No prior exam. Performing Technologist: Edilia Elden Appl  Examination Guidelines: A complete evaluation includes B-mode imaging, spectral Doppler, color Doppler, and power Doppler as needed of all accessible portions of each vessel. Bilateral testing is considered an integral part of a complete examination. Limited examinations for reoccurring indications may be performed as noted.  Left Doppler Findings: +---------------+----------+--------+--------+----------+ Site           PSV (cm/s)WaveformStenosisComments   +---------------+----------+--------+--------+----------+ Subclavian Mid 43        biphasic                   +---------------+----------+--------+--------+----------+ Subclavian Dist55        biphasic                    +---------------+----------+--------+--------+----------+ Axillary       50        biphasic                   +---------------+----------+--------+--------+----------+ Brachial Prox  41        biphasic                   +---------------+----------+--------+--------+----------+  Brachial Mid   29        biphasic                   +---------------+----------+--------+--------+----------+ Brachial Dist  87        biphasic        Aneurysmal +---------------+----------+--------+--------+----------+ Radial Prox    39        biphasic                   +---------------+----------+--------+--------+----------+ Radial Mid     42        biphasic                   +---------------+----------+--------+--------+----------+ Radial Dist    17        biphasic        Low flow   +---------------+----------+--------+--------+----------+ Ulnar Prox     60        biphasic                   +---------------+----------+--------+--------+----------+ Ulnar Mid      66        biphasic                   +---------------+----------+--------+--------+----------+ Ulnar Dist     67        biphasic                   +---------------+----------+--------+--------+----------+ 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 *See table(s) above for measurements and observations. Electronically signed by Penne Colorado MD on 07/10/2024 at 6:13:47 PM.    Final    Medications:  sodium chloride       Chlorhexidine  Gluconate Cloth  6 each Topical Q0600   heparin   5,000 Units Subcutaneous Q8H   pantoprazole   40 mg Oral Daily   predniSONE   5 mg Oral Q breakfast   sodium chloride  flush  3 mL Intravenous Q12H   tacrolimus  ER  4 mg Oral Daily    Dialysis Orders [facility] MWF 4h  B400  97.5kg  TDC  Heparin   2500 Last OP HD 1/21, post wt 98.3kg Dry wt raised 2 times in last 3 wks      Assessment/ Plan:   # ESRD - on HD MWF - Next HD 1/26, ok to discharge from renal standpoint. Reminded to watch fluids/K over weekend and monitor news for HD center delay on Monday   # L AVF pseudoaneurysm - US  suggested pseudoaneurysm, but s/p angiogram which did not show this - no intervention needed.   # Hyperkalemia - S/p Lokelma  + HD, resolved   # HTN/volume - BP better, follow. Not getting BP meds here   # Anemia of esrd - Hb 12-13 here, follow, no ESA needs   # Secondary HPTH - Ca ok, continue home meds   # h/o failed renal transplant - Current home meds are pred 5mg , Envarsus  4mg  daily - adjusted   # Hx recurrent DVT - on Eliquis at home (has not been getting here)   [name], PA-C 07/12/2024, 9:40 AM  [facility]    "

## 2024-07-12 NOTE — TOC Transition Note (Signed)
 Transition of Care [facility]) - Discharge Note   Patient Details  Name: Nicholas Green MRN: [id number] Date of Birth: 11/24/1965  Transition of Care [facility]) CM/SW Contact:  Tom-Johnson, Harvest Muskrat, RN Phone Number: 07/12/2024, 12:50 PM   Clinical Narrative:     Patient is scheduled for discharge today.  Readmission Risk Assessment done. Outpatient f/u, hospital f/u and discharge instructions on AVS. Prescriptions sent to [facility] pharmacy and patient will receive meds prior discharge. No ICM needs or recommendations noted. Patient will transport self at discharge.  No further ICM needs noted.       Final next level of care: Home/Self Care Barriers to Discharge: Barriers Resolved   Patient Goals and CMS Choice Patient states their goals for this hospitalization and ongoing recovery are:: To return home CMS Medicare.gov Compare Post Acute Care list provided to:: Patient Choice offered to / list presented to : NA      Discharge Placement                Patient to be transferred to facility by: Self      Discharge Plan and Services Additional resources added to the After Visit Summary for                  DME Arranged: N/A DME Agency: NA       HH Arranged: NA HH Agency: NA        Social Drivers of Health (SDOH) Interventions SDOH Screenings   Food Insecurity: No Food Insecurity (07/10/2024)  Recent Concern: Food Insecurity - Food Insecurity Present (05/13/2024)  Housing: Low Risk (07/10/2024)  Transportation Needs: No Transportation Needs (07/10/2024)  Utilities: Not At Risk (07/10/2024)  Depression (PHQ2-9): Low Risk (05/13/2024)  Financial Resource Strain: Not on File (09/07/2021)   Received from [facility]  Physical Activity: Not on File (09/07/2021)   Received from [facility]  Social Connections: Not on File (03/03/2023)   Received from [facility]  Stress: Not on File (09/07/2021)   Received from [facility]  Tobacco Use: Medium Risk (07/09/2024)     Readmission Risk  Interventions     No data to display

## 2024-07-12 NOTE — Discharge Summary (Signed)
 " Physician Discharge Summary   Patient: Nicholas Green MRN: [id number] DOB: [birth year]  Admit date:     07/09/2024  Discharge date: 07/12/24  Discharge Physician: Reyes VEAR Gaw   PCP: Catalina Bare, MD   Recommendations at discharge:   Patient recommended to follow with dialysis regular Monday-Wednesday-Friday schedule. Fistulogram performed in house was completely normal.  No evidence of pseudoaneurysm Patient came in on 600 mg 4 times daily dosing of gabapentin .  Unclear if he was actually taking this much.  We changed his gabapentin  to 300 mg 3 times weekly after dialysis to prevent serum accumulation and resultant side effects/complications.  Discharge Diagnoses: 59 y.o. male ith history of ESRD on HD MWF, prior failed kidney transplant, CVA comes to the hospital with left arm pain where his old dialysis access was.  Dialysis team at the outpatient dialysis center was concerned about possible AVF aneurysm.  Vascular team was consulted for pulsatile mass in the left arm and was asked to admit.    Patient underwent left upper extremity angiogram on 07/11/2024 by vascular surgeon.  No evidence of pseudoaneurysm.  Patient was started on analgesics for pain.  On hospital day #2, pain and completely resolved.  Also reported that swelling of left upper extremity had also resolved and was back to normal per patient report.  He had no further complaints or concerns.  Because of this we are able to discharge him on 07/12/2024 with regular follow-up with his outpatient dialysis Monday/Wednesday/Friday.  He did receive dialysis on Friday 1/23 as per his regular schedule.  Hospital Course: Left arm pain/antecubital pulsatile mass:  - S/p left upper extremity angiogram on 07/11/2024 by vascular surgeon.  No evidence of pseudoaneurysm was found. Analgesics as needed for pain.  IV Dilaudid  ordered for complaint of 10 out of 10 pain in the left upper extremity. -On hospital day #2 as above his  swelling and pain to completely resolved.  Unclear what caused original swelling and pain.    ESRD on HD:  - Plan for hemodialysis 07/11/2024. Hyperkalemia: Treat with Lokelma .  Plan for hemodialysis today.   -Electrolytes resolved to normal after dialysis.    History of renal transplant:  - Continue tacrolimus , low-dose prednisone  and mycophenolate .   Comorbidities include chronic thrombocytopenia, chronic right upper extremity lymphedema.  No changes while in house.      Consultants: Nephrology, vascular surgery Procedures performed: Fistulogram in house which was negative for pseudoaneurysm Disposition: Home Diet recommendation:  Renal diet DISCHARGE MEDICATION: Allergies as of 07/12/2024       Reactions   Ivp Dye [iodinated Contrast Media] Swelling   Metrizamide Swelling   Latex Itching, Dermatitis        Medication List     TAKE these medications    apixaban 5 MG Tabs tablet Commonly known as: ELIQUIS Take 5 mg by mouth 2 (two) times daily.   gabapentin  600 MG tablet Commonly known as: NEURONTIN  Take 300 mg three times per week after dialysis What changed:  how much to take how to take this when to take this additional instructions   multivitamin Tabs tablet Take 1 tablet by mouth daily.   omeprazole 40 MG capsule Commonly known as: PRILOSEC Take 40 mg by mouth daily.   Oxycodone  HCl 10 MG Tabs Take 10 mg by mouth in the morning, at noon, in the evening, and at bedtime.   predniSONE  5 MG tablet Commonly known as: DELTASONE  Take 5 mg by mouth daily with breakfast.   sevelamer  carbonate 800 MG tablet Commonly known as: RENVELA Take 1,600 mg by mouth 3 (three) times daily. Take two tablets (1600mg )  three times a day with meals. Take one tablet (800mg ) by mouth with snacks.   tacrolimus  ER 4 MG Tb24 Commonly known as: ENVARSUS  XR Take 4 mg by mouth daily.   tiZANidine 2 MG tablet Commonly known as: ZANAFLEX Take 2 mg by mouth at bedtime.    Vitamin D (Ergocalciferol) 50 MCG (2000 UT) Caps Take 2,000 Units by mouth in the morning.        Discharge Exam: Filed Weights   07/09/24 1619 07/11/24 1658 07/11/24 2039  Weight: 97 kg (S) 100.1 kg (S) 97.9 kg   Gen:  Alert, cooperative patient who appears stated age in no acute distress.  Vital signs reviewed. Heart:  RRR Lungs:  CTAB Abd:  S/ND/NT Skin: Permacath on left upper chest Extremity: Chronic right extremity lymphedema.  Left upper extremity fistula with palpable thrill and nontender  Condition at discharge: Good  The results of significant diagnostics from this hospitalization (including imaging, microbiology, ancillary and laboratory) are listed below for reference.   Imaging Studies: PERIPHERAL VASCULAR CATHETERIZATION Result Date: 07/11/2024 Images from the original result were not included.   Patient name: Nicholas Green        MRN: [id number]        DOB: June 07, 1966          Sex: male  07/11/2024 Pre-operative Diagnosis: ESRD Post-operative diagnosis:  Same Surgeon:  Malvina New Procedure Performed:            1.  U/s guided access, right femoral artery            2.  Aortic arch angiogram            3.  Left upper extremity angiogram            4.  Selective injection with catheter in left brachial artery            5.  Conscious sedation, 24 minutes            6.  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  He comes in today for angiographic evaluation and possible treatment.  Procedure:  The patient was identified in the holding area and taken to room 8.  The patient was then placed supine on the table and prepped and draped in the usual sterile fashion.  A time out was called.  Conscious sedation was administered with the use of IV fentanyl  and Versed  under continuous physician and  nurse monitoring.  Heart rate, blood pressure, and oxygen saturation were continuously monitored.  Total sedation time was 24 minutes.  Ultrasound was used to evaluate the right common femoral artery.  It was patent .  A digital ultrasound image was acquired.  A micropuncture needle was used to access the right common femoral artery under ultrasound guidance.  An 018 wire was advanced without resistance and a micropuncture sheath was placed.  The 018 wire was removed and a benson wire was placed.  The micropuncture sheath was exchanged for a 5 french sheath.  The pigtail catheter was advanced into the ascending aorta and an aortic arch angiogram was performed.  Next using a Berenstein 2 catheter  Glidewire manage the left subclavian artery was selected.  The cath was advanced into the left brachial artery and left upper arm angiogram was performed.  The groin was closed with a Celt   Findings:            Aortic arch: A type I aortic arch was identified with no significant great vessel stenosis.  Possible bovine configuration however this was not fully defined.             Left upper extremity: The left subclavian, left axillary and brachial artery are widely patent.  The brachial artery is ectatic down to the antecubital crease.  It narrows down at this level with possible stenosis.  There is single-vessel runoff via the ulnar artery.  No obvious pseudoaneurysm is identified   Impression:             #1  No obvious pseudoaneurysm identified in the left brachial artery   V. Malvina New, M.D., [facility] [place] Office: [phone] Pager:  [phone]   VAS US  UPPER EXTREMITY ARTERIAL DUPLEX Result Date: 07/10/2024  UPPER EXTREMITY DUPLEX STUDY Patient Name:  Nicholas Green  Date of Exam:   07/10/2024 Medical Rec #: [id number]         Accession #:    [id number] Date of Birth: September 09, 1965         Patient Gender: M Patient Age:   74 years Exam Location:  [facility] Procedure:       VAS US  UPPER EXTREMITY ARTERIAL DUPLEX Referring Phys: REDIA CLEAVER --------------------------------------------------------------------------------  Indications: Palpable knot, bruit and pain, rule out aneurysm. History:     Patient has a history of upper extremity pain.  Risk Factors:  Hypertension, past history of smoking, prior CVA. Other Factors: ESRD on HD Limitations: Multiple occluded dialysis grafts noted throughout the upper              extremity, Comparison Study: No prior exam. Performing Technologist: Edilia Elden Appl  Examination Guidelines: A complete evaluation includes B-mode imaging, spectral Doppler, color Doppler, and power Doppler as needed of all accessible portions of each vessel. Bilateral testing is considered an integral part of a complete examination. Limited examinations for reoccurring indications may be performed as noted.  Left Doppler Findings: +---------------+----------+--------+--------+----------+ Site           PSV (cm/s)WaveformStenosisComments   +---------------+----------+--------+--------+----------+ Subclavian Mid 43        biphasic                   +---------------+----------+--------+--------+----------+ Subclavian Dist55        biphasic                   +---------------+----------+--------+--------+----------+ Axillary       50        biphasic                   +---------------+----------+--------+--------+----------+ Brachial Prox  41        biphasic                   +---------------+----------+--------+--------+----------+ Brachial Mid   29        biphasic                   +---------------+----------+--------+--------+----------+ Brachial Dist  87        biphasic        Aneurysmal +---------------+----------+--------+--------+----------+ Radial Prox    39  biphasic                   +---------------+----------+--------+--------+----------+ Radial Mid     42        biphasic                    +---------------+----------+--------+--------+----------+ Radial Dist    17        biphasic        Low flow   +---------------+----------+--------+--------+----------+ Ulnar Prox     60        biphasic                   +---------------+----------+--------+--------+----------+ Ulnar Mid      66        biphasic                   +---------------+----------+--------+--------+----------+ Ulnar Dist     67        biphasic                   +---------------+----------+--------+--------+----------+ 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 *See table(s) above for measurements and observations. Electronically signed by Penne Colorado MD on 07/10/2024 at 6:13:47 PM.    Final     Microbiology: Results for orders placed or performed during the hospital encounter of 07/18/21  Resp Panel by RT-PCR (Flu A&B, Covid) Nasopharyngeal Swab     Status: None   Collection Time: 07/18/21  5:29 PM   Specimen: Nasopharyngeal Swab; Nasopharyngeal(NP) swabs in vial transport medium  Result Value Ref Range Status   SARS Coronavirus 2 by RT PCR NEGATIVE NEGATIVE Final    Comment: (NOTE) SARS-CoV-2 target nucleic acids are NOT DETECTED.  The SARS-CoV-2 RNA is generally detectable in upper respiratory specimens during the acute phase of infection. The lowest concentration of SARS-CoV-2 viral copies this assay can detect is 138 copies/mL. A negative result does not preclude SARS-Cov-2 infection and should not be used as the sole basis for treatment or other patient management decisions. A negative result may occur with  improper specimen collection/handling, submission of specimen other than nasopharyngeal swab, presence of viral mutation(s) within the areas targeted by this assay, and inadequate number of  viral copies(<138 copies/mL). A negative result must be combined with clinical observations, patient history, and epidemiological information. The expected result is Negative.  Fact Sheet for Patients:  bloggercourse.com  Fact Sheet for Healthcare Providers:  seriousbroker.it  This test is no t yet approved or cleared by the [place]  FDA and  has been authorized for detection and/or diagnosis of SARS-CoV-2 by FDA under an Emergency Use Authorization (EUA). This EUA will remain  in effect (meaning this test can be used) for the duration of the COVID-19 declaration under Section 564(b)(1) of the Act, 21 U.S.C.section 360bbb-3(b)(1), unless the authorization is terminated  or revoked sooner.       Influenza A by PCR NEGATIVE NEGATIVE Final   Influenza B by PCR NEGATIVE NEGATIVE Final    Comment: (NOTE) The Xpert Xpress SARS-CoV-2/FLU/RSV plus assay is intended as an aid in the diagnosis of influenza from Nasopharyngeal swab specimens and should not be used as a sole basis for treatment. Nasal washings and aspirates are unacceptable for Xpert Xpress SARS-CoV-2/FLU/RSV testing.  Fact Sheet for Patients: bloggercourse.com  Fact Sheet for Healthcare Providers: seriousbroker.it  This test is not yet approved or cleared by the [place]  FDA and has been authorized for detection and/or diagnosis of SARS-CoV-2 by FDA under an Emergency Use Authorization (EUA). This EUA will remain in effect (meaning this test can be used) for the duration of the COVID-19 declaration under Section 564(b)(1) of the Act, 21 U.S.C. section 360bbb-3(b)(1), unless the authorization is terminated or revoked.  Performed at [facility], [address] [place], [place] [address]     Labs: CBC: Recent Labs  Lab 07/09/24 1915 07/10/24 0555 07/11/24 0700 07/12/24 0435  WBC 3.4* 3.6*  4.0 4.9  HGB 12.2* 12.8* 12.4* 12.4*  HCT 38.6* 39.7 38.8* 38.2*  MCV 104.9* 103.7* 103.5* 102.4*  PLT 87* 90* 83* 107*   Basic Metabolic Panel: Recent Labs  Lab 07/09/24 1915 07/10/24 0555 07/11/24 0700 07/12/24 0435  NA 141 137 136 138  K 4.2 5.6* 6.2* 4.4  CL 98 99 98 96*  CO2 26 21* 20* 23  GLUCOSE 122* 125* 143* 210*  BUN 45* 54* 83* 66*  CREATININE 8.19* 9.64* 12.00* 9.26*  CALCIUM  8.7* 9.1 8.7* 8.3*   Liver Function Tests: Recent Labs  Lab 07/10/24 0555  AST 28  ALT 34  ALKPHOS 107  BILITOT 0.5  PROT 8.5*  ALBUMIN 4.1   CBG: Recent Labs  Lab 07/10/24 1206 07/10/24 1609  GLUCAP 79 117*    Discharge time spent: Less than 30 minutes.  Signed: Reyes VEAR Gaw, MD [facility] 07/12/2024 "

## 2024-07-14 LAB — HEPATITIS B SURFACE ANTIBODY, QUANTITATIVE: Hep B S AB Quant (Post): 141 m[IU]/mL

## 2024-07-14 NOTE — Progress Notes (Signed)
 Late note entry 1/26 1013 am  D/c over weekend noted. Contacted out-pt HD clinic [facility] and informed of pt d/c and anticipated arrival back to clinic on Monday. No further support is needed.     Lavanda Harmony Sandell Dialysis Navigator [phone]

## 2024-07-16 ENCOUNTER — Encounter (HOSPITAL_COMMUNITY): Payer: Self-pay | Admitting: Surgery

## 2024-11-03 ENCOUNTER — Inpatient Hospital Stay

## 2024-11-03 ENCOUNTER — Inpatient Hospital Stay: Admitting: Hematology and Oncology
